# Patient Record
Sex: Female | Born: 1970 | Race: Black or African American | Hispanic: No | Marital: Single | Smoking: Current some day smoker
Health system: Southern US, Community
[De-identification: ages and names within clinical notes are randomized; demographics above are authoritative.]

## PROBLEM LIST (undated history)

## (undated) DIAGNOSIS — R51 Headache: Secondary | ICD-10-CM

## (undated) DIAGNOSIS — R519 Headache, unspecified: Secondary | ICD-10-CM

## (undated) DIAGNOSIS — J45909 Unspecified asthma, uncomplicated: Secondary | ICD-10-CM

## (undated) DIAGNOSIS — C8190 Hodgkin lymphoma, unspecified, unspecified site: Secondary | ICD-10-CM

## (undated) DIAGNOSIS — S2239XA Fracture of one rib, unspecified side, initial encounter for closed fracture: Secondary | ICD-10-CM

## (undated) HISTORY — PX: TOE SURGERY: SHX1073

## (undated) HISTORY — DX: Fracture of one rib, unspecified side, initial encounter for closed fracture: S22.39XA

---

## 1998-02-14 ENCOUNTER — Inpatient Hospital Stay (HOSPITAL_COMMUNITY): Admission: AD | Admit: 1998-02-14 | Discharge: 1998-02-14 | Payer: Self-pay | Admitting: Nephrology

## 1998-03-13 ENCOUNTER — Other Ambulatory Visit: Admission: RE | Admit: 1998-03-13 | Discharge: 1998-03-13 | Payer: Self-pay | Admitting: Nephrology

## 1998-05-10 ENCOUNTER — Inpatient Hospital Stay (HOSPITAL_COMMUNITY): Admission: AD | Admit: 1998-05-10 | Discharge: 1998-05-10 | Payer: Self-pay | Admitting: Nephrology

## 1998-10-29 ENCOUNTER — Encounter: Payer: Self-pay | Admitting: Emergency Medicine

## 1998-10-29 ENCOUNTER — Emergency Department (HOSPITAL_COMMUNITY): Admission: EM | Admit: 1998-10-29 | Discharge: 1998-10-29 | Payer: Self-pay | Admitting: Emergency Medicine

## 1999-02-15 ENCOUNTER — Ambulatory Visit (HOSPITAL_BASED_OUTPATIENT_CLINIC_OR_DEPARTMENT_OTHER): Admission: RE | Admit: 1999-02-15 | Discharge: 1999-02-15 | Payer: Self-pay | Admitting: Ophthalmology

## 1999-05-09 ENCOUNTER — Inpatient Hospital Stay (HOSPITAL_COMMUNITY): Admission: AD | Admit: 1999-05-09 | Discharge: 1999-05-09 | Payer: Self-pay | Admitting: Nephrology

## 1999-08-01 ENCOUNTER — Inpatient Hospital Stay (HOSPITAL_COMMUNITY): Admission: RE | Admit: 1999-08-01 | Discharge: 1999-08-01 | Payer: Self-pay | Admitting: Nephrology

## 1999-09-08 ENCOUNTER — Emergency Department (HOSPITAL_COMMUNITY): Admission: EM | Admit: 1999-09-08 | Discharge: 1999-09-08 | Payer: Self-pay | Admitting: Emergency Medicine

## 1999-09-08 ENCOUNTER — Encounter: Payer: Self-pay | Admitting: Emergency Medicine

## 1999-12-02 ENCOUNTER — Inpatient Hospital Stay (HOSPITAL_COMMUNITY): Admission: AD | Admit: 1999-12-02 | Discharge: 1999-12-02 | Payer: Self-pay | Admitting: Nephrology

## 2000-02-17 ENCOUNTER — Inpatient Hospital Stay (HOSPITAL_COMMUNITY): Admission: EM | Admit: 2000-02-17 | Discharge: 2000-02-17 | Payer: Self-pay | Admitting: *Deleted

## 2000-05-12 ENCOUNTER — Inpatient Hospital Stay (HOSPITAL_COMMUNITY): Admission: RE | Admit: 2000-05-12 | Discharge: 2000-05-12 | Payer: Self-pay | Admitting: *Deleted

## 2000-08-06 ENCOUNTER — Inpatient Hospital Stay (HOSPITAL_COMMUNITY): Admission: AD | Admit: 2000-08-06 | Discharge: 2000-08-06 | Payer: Self-pay | Admitting: *Deleted

## 2000-09-19 ENCOUNTER — Emergency Department (HOSPITAL_COMMUNITY): Admission: EM | Admit: 2000-09-19 | Discharge: 2000-09-19 | Payer: Self-pay | Admitting: Emergency Medicine

## 2000-11-02 ENCOUNTER — Inpatient Hospital Stay (HOSPITAL_COMMUNITY): Admission: AD | Admit: 2000-11-02 | Discharge: 2000-11-02 | Payer: Self-pay | Admitting: *Deleted

## 2001-01-25 ENCOUNTER — Inpatient Hospital Stay (HOSPITAL_COMMUNITY): Admission: AD | Admit: 2001-01-25 | Discharge: 2001-01-25 | Payer: Self-pay | Admitting: *Deleted

## 2003-01-31 ENCOUNTER — Other Ambulatory Visit: Admission: RE | Admit: 2003-01-31 | Discharge: 2003-01-31 | Payer: Self-pay | Admitting: Obstetrics & Gynecology

## 2004-07-10 ENCOUNTER — Other Ambulatory Visit: Admission: RE | Admit: 2004-07-10 | Discharge: 2004-07-10 | Payer: Self-pay | Admitting: Obstetrics & Gynecology

## 2005-08-20 ENCOUNTER — Other Ambulatory Visit: Admission: RE | Admit: 2005-08-20 | Discharge: 2005-08-20 | Payer: Self-pay | Admitting: Obstetrics & Gynecology

## 2006-05-01 ENCOUNTER — Ambulatory Visit: Payer: Self-pay | Admitting: Internal Medicine

## 2006-05-05 ENCOUNTER — Ambulatory Visit: Payer: Self-pay | Admitting: Internal Medicine

## 2006-05-14 ENCOUNTER — Ambulatory Visit: Payer: Self-pay | Admitting: Internal Medicine

## 2006-05-21 ENCOUNTER — Encounter: Admission: RE | Admit: 2006-05-21 | Discharge: 2006-05-21 | Payer: Self-pay | Admitting: Internal Medicine

## 2008-01-11 ENCOUNTER — Emergency Department (HOSPITAL_COMMUNITY): Admission: EM | Admit: 2008-01-11 | Discharge: 2008-01-11 | Payer: Self-pay | Admitting: Emergency Medicine

## 2008-01-13 ENCOUNTER — Encounter: Payer: Self-pay | Admitting: Internal Medicine

## 2010-07-28 ENCOUNTER — Emergency Department (HOSPITAL_COMMUNITY): Admission: EM | Admit: 2010-07-28 | Discharge: 2010-07-28 | Payer: Self-pay | Admitting: Emergency Medicine

## 2010-10-02 ENCOUNTER — Emergency Department (HOSPITAL_COMMUNITY)
Admission: EM | Admit: 2010-10-02 | Discharge: 2010-10-02 | Payer: Self-pay | Source: Home / Self Care | Admitting: Emergency Medicine

## 2012-11-19 ENCOUNTER — Emergency Department (INDEPENDENT_AMBULATORY_CARE_PROVIDER_SITE_OTHER)
Admission: EM | Admit: 2012-11-19 | Discharge: 2012-11-19 | Disposition: A | Discharge status: Home / Self Care | Payer: Medicaid Other | Source: Home / Self Care

## 2012-11-19 ENCOUNTER — Encounter (HOSPITAL_COMMUNITY): Payer: Self-pay

## 2012-11-19 DIAGNOSIS — R030 Elevated blood-pressure reading, without diagnosis of hypertension: Secondary | ICD-10-CM

## 2012-11-19 DIAGNOSIS — B029 Zoster without complications: Secondary | ICD-10-CM

## 2012-11-19 HISTORY — DX: Unspecified asthma, uncomplicated: J45.909

## 2012-11-19 HISTORY — DX: Headache, unspecified: R51.9

## 2012-11-19 HISTORY — DX: Headache: R51

## 2012-11-19 LAB — POCT URINALYSIS DIP (DEVICE)
Bilirubin Urine: NEGATIVE
Glucose, UA: NEGATIVE mg/dL
Ketones, ur: NEGATIVE mg/dL
Leukocytes, UA: NEGATIVE
Nitrite: NEGATIVE
Protein, ur: NEGATIVE mg/dL
Specific Gravity, Urine: 1.025 (ref 1.005–1.030)
Urobilinogen, UA: 0.2 mg/dL (ref 0.0–1.0)
pH: 5.5 (ref 5.0–8.0)

## 2012-11-19 LAB — POCT PREGNANCY, URINE: Preg Test, Ur: NEGATIVE

## 2012-11-19 MED ORDER — HYDROCODONE-ACETAMINOPHEN 5-325 MG PO TABS: 1.0000 | ORAL_TABLET | ORAL | Status: DC | PRN

## 2012-11-19 MED ORDER — ACYCLOVIR 800 MG PO TABS: 800.0000 mg | ORAL_TABLET | Freq: Every day | ORAL | Status: DC

## 2012-11-19 MED ORDER — CYCLOBENZAPRINE HCL 10 MG PO TABS: 10.0000 mg | ORAL_TABLET | Freq: Three times a day (TID) | ORAL | Status: DC | PRN

## 2012-11-19 MED ORDER — IBUPROFEN 600 MG PO TABS
600.0000 mg | ORAL_TABLET | Freq: Four times a day (QID) | ORAL | Status: DC | PRN
Start: 2012-11-19 — End: 2013-07-30

## 2012-11-19 NOTE — ED Provider Notes (Signed)
History     CSN: [id number]  Arrival date & time 11/19/12  1311   First MD Initiated Contact with Patient 11/19/12 1504      Chief Complaint  Patient presents with  . Flank Pain    (Consider location/radiation/quality/duration/timing/severity/associated sxs/prior treatment) HPI 42 y.o. female with pain Right flank/back now moving around to front. Sharp/crampy. Worse with movement. Broke out in red blisters on abdomen near belly button starting last night. Pain in abdomen is sharp. No fever/chills/sweats. No nausea, vomiting, constipation, diarrhea. Some numbness on front of stomach on right near blisterse. No vaginal discharge, discomfort, or dysuria. Has had some abdominal bloating/gas recently relieved by gas-ex. Has been taking Advil for pain which helps some.  LMP:  2 years ago. Last pap 2 yrs ago. Saw OB but didn't have health insurance so not followed up.   Past Medical History  Diagnosis Date  . Asthma   . Head ache   Hodgkins lymphoma age 26, treated at [facility], completed treatment and in remission, released from care.   History reviewed. No pertinent past surgical history. Bone marrow biopsy.  History reviewed. No pertinent family history. Cancer (PGF prostate).  History  Substance Use Topics  . Smoking status: Not on file  . Smokeless tobacco: Not on file  . Alcohol Use:     OB History    Grav Para Term Preterm Abortions TAB SAB Ect Mult Living                  Review of Systems  Constitutional: Negative for fever, chills, diaphoresis and fatigue.  HENT: Negative for neck pain and neck stiffness.   Respiratory: Negative for cough and shortness of breath.   Cardiovascular: Negative for chest pain and palpitations.  Gastrointestinal: Positive for abdominal distention. Negative for nausea, vomiting, diarrhea and constipation.  Genitourinary: Positive for flank pain. Negative for dysuria, urgency, frequency, vaginal bleeding, vaginal discharge,  difficulty urinating and pelvic pain.  Musculoskeletal: Positive for back pain (upper right - just at/under rib cage).  Skin: Positive for rash.  Neurological: Negative for dizziness, weakness, light-headedness and headaches.    Allergies  Review of patient's allergies indicates no known allergies.  Home Medications  No current outpatient prescriptions on file.  BP 153/79  Pulse 78  Temp 97.9 F (36.6 C) (Oral)  Resp 18  SpO2 99%  Physical Exam  Constitutional: She is oriented to person, place, and time. She appears well-developed and well-nourished. No distress.  HENT:  Head: Normocephalic and atraumatic.  Eyes: Conjunctivae normal and EOM are normal.  Neck: Normal range of motion. Neck supple.  Cardiovascular: Normal rate, regular rhythm and normal heart sounds.   Pulmonary/Chest: Effort normal and breath sounds normal. No respiratory distress. She has no wheezes.  Abdominal: Soft. Bowel sounds are normal. She exhibits no distension. There is no tenderness. There is no rebound and no guarding.    Musculoskeletal: Normal range of motion. She exhibits no edema and no tenderness.       No tenderness of back, no CVA tenderness  Neurological: She is alert and oriented to person, place, and time.  Skin: Skin is warm and dry.       2 small clusters of red, vesicular lesions on right anterior abdomen, one just near umbilicus, other a few cm lateral and inferior to umbilicus.  Psychiatric: She has a normal mood and affect.   Blsters clustered on right umbilicus and just below;.  Results for orders placed during the hospital encounter  of 11/19/12 (from the past 24 hour(s))  POCT URINALYSIS DIP (DEVICE)     Status: Abnormal   Collection Time   11/19/12  2:41 PM      Component Value Range   Glucose, UA NEGATIVE  NEGATIVE mg/dL   Bilirubin Urine NEGATIVE  NEGATIVE   Ketones, ur NEGATIVE  NEGATIVE mg/dL   Specific Gravity, Urine 1.025  1.005 - 1.030   Hgb urine dipstick TRACE (*)  NEGATIVE   pH 5.5  5.0 - 8.0   Protein, ur NEGATIVE  NEGATIVE mg/dL   Urobilinogen, UA 0.2  0.0 - 1.0 mg/dL   Nitrite NEGATIVE  NEGATIVE   Leukocytes, UA NEGATIVE  NEGATIVE  POCT PREGNANCY, URINE     Status: Normal   Collection Time   11/19/12  2:43 PM      Component Value Range   Preg Test, Ur NEGATIVE  NEGATIVE    ED Course  Procedures (including critical care time)  Labs Reviewed  POCT URINALYSIS DIP (DEVICE) - Abnormal; Notable for the following:    Hgb urine dipstick TRACE (*)     All other components within normal limits  POCT PREGNANCY, URINE   No results found.   1. Shingles   2. Elevated blood pressure     - Acyclovir 800 mg 5x daily x 7 days - Ibuprofen - vicodin for nighttime pain - Establish care with PCP for BP f/u        Napoleon Form, MD 11/19/12 1617

## 2012-11-19 NOTE — ED Notes (Signed)
C/o 2-3 day duration of pain in her right flank to groin w "numb " area on flank and raised bumps near  Her  Navel

## 2012-11-19 NOTE — ED Notes (Signed)
C/o pain in her back; assessed by MD only

## 2013-07-30 ENCOUNTER — Emergency Department (HOSPITAL_COMMUNITY): Payer: Medicaid Other

## 2013-07-30 ENCOUNTER — Encounter (HOSPITAL_COMMUNITY): Payer: Self-pay | Admitting: Emergency Medicine

## 2013-07-30 ENCOUNTER — Emergency Department (HOSPITAL_COMMUNITY)
Admission: EM | Admit: 2013-07-30 | Discharge: 2013-07-30 | Disposition: A | Discharge status: Home / Self Care | Payer: Medicaid Other | Attending: Emergency Medicine | Admitting: Emergency Medicine

## 2013-07-30 DIAGNOSIS — M25539 Pain in unspecified wrist: Secondary | ICD-10-CM | POA: Insufficient documentation

## 2013-07-30 DIAGNOSIS — R209 Unspecified disturbances of skin sensation: Secondary | ICD-10-CM | POA: Insufficient documentation

## 2013-07-30 DIAGNOSIS — M25569 Pain in unspecified knee: Secondary | ICD-10-CM | POA: Insufficient documentation

## 2013-07-30 DIAGNOSIS — Z8571 Personal history of Hodgkin lymphoma: Secondary | ICD-10-CM | POA: Insufficient documentation

## 2013-07-30 DIAGNOSIS — G8911 Acute pain due to trauma: Secondary | ICD-10-CM | POA: Insufficient documentation

## 2013-07-30 DIAGNOSIS — S6991XD Unspecified injury of right wrist, hand and finger(s), subsequent encounter: Secondary | ICD-10-CM

## 2013-07-30 DIAGNOSIS — G839 Paralytic syndrome, unspecified: Secondary | ICD-10-CM | POA: Insufficient documentation

## 2013-07-30 DIAGNOSIS — G589 Mononeuropathy, unspecified: Secondary | ICD-10-CM

## 2013-07-30 DIAGNOSIS — J45909 Unspecified asthma, uncomplicated: Secondary | ICD-10-CM | POA: Insufficient documentation

## 2013-07-30 DIAGNOSIS — F17200 Nicotine dependence, unspecified, uncomplicated: Secondary | ICD-10-CM | POA: Insufficient documentation

## 2013-07-30 HISTORY — DX: Hodgkin lymphoma, unspecified, unspecified site: C81.90

## 2013-07-30 NOTE — ED Notes (Signed)
Pt states she was prescribed pain medication but has not taken it because she "doesn't like pain medication". Denies taking anything otc either
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
## 2013-10-13 ENCOUNTER — Emergency Department (HOSPITAL_COMMUNITY): Payer: No Typology Code available for payment source

## 2013-10-13 ENCOUNTER — Other Ambulatory Visit: Payer: Self-pay

## 2013-10-13 ENCOUNTER — Emergency Department (HOSPITAL_COMMUNITY)
Admission: EM | Admit: 2013-10-13 | Discharge: 2013-10-13 | Disposition: A | Discharge status: Home / Self Care | Payer: No Typology Code available for payment source | Attending: Emergency Medicine | Admitting: Emergency Medicine

## 2013-10-13 ENCOUNTER — Encounter (HOSPITAL_COMMUNITY): Payer: Self-pay | Admitting: Emergency Medicine

## 2013-10-13 DIAGNOSIS — Y939 Activity, unspecified: Secondary | ICD-10-CM | POA: Insufficient documentation

## 2013-10-13 DIAGNOSIS — R51 Headache: Secondary | ICD-10-CM | POA: Insufficient documentation

## 2013-10-13 DIAGNOSIS — S2239XA Fracture of one rib, unspecified side, initial encounter for closed fracture: Secondary | ICD-10-CM | POA: Insufficient documentation

## 2013-10-13 DIAGNOSIS — M549 Dorsalgia, unspecified: Secondary | ICD-10-CM | POA: Insufficient documentation

## 2013-10-13 DIAGNOSIS — Y92410 Unspecified street and highway as the place of occurrence of the external cause: Secondary | ICD-10-CM | POA: Insufficient documentation

## 2013-10-13 DIAGNOSIS — Z8669 Personal history of other diseases of the nervous system and sense organs: Secondary | ICD-10-CM | POA: Insufficient documentation

## 2013-10-13 DIAGNOSIS — Z8571 Personal history of Hodgkin lymphoma: Secondary | ICD-10-CM | POA: Diagnosis not present

## 2013-10-13 DIAGNOSIS — S2231XA Fracture of one rib, right side, initial encounter for closed fracture: Secondary | ICD-10-CM

## 2013-10-13 DIAGNOSIS — J45909 Unspecified asthma, uncomplicated: Secondary | ICD-10-CM | POA: Insufficient documentation

## 2013-10-13 DIAGNOSIS — IMO0001 Reserved for inherently not codable concepts without codable children: Secondary | ICD-10-CM | POA: Insufficient documentation

## 2013-10-13 DIAGNOSIS — F17200 Nicotine dependence, unspecified, uncomplicated: Secondary | ICD-10-CM | POA: Insufficient documentation

## 2013-10-13 DIAGNOSIS — S298XXA Other specified injuries of thorax, initial encounter: Secondary | ICD-10-CM | POA: Diagnosis present

## 2013-10-13 MED ORDER — OXYCODONE-ACETAMINOPHEN 5-325 MG PO TABS: ORAL_TABLET | ORAL | Status: DC

## 2013-10-13 MED ORDER — OXYCODONE-ACETAMINOPHEN 5-325 MG PO TABS
2.0000 | ORAL_TABLET | Freq: Once | ORAL | Status: AC
  Administered 2013-10-13: 2 via ORAL
  Filled 2013-10-13: qty 2

## 2013-10-13 MED ORDER — IBUPROFEN 600 MG PO TABS: 600.0000 mg | ORAL_TABLET | Freq: Four times a day (QID) | ORAL | Status: DC | PRN

## 2013-10-13 MED ORDER — IBUPROFEN 800 MG PO TABS
800.0000 mg | ORAL_TABLET | Freq: Once | ORAL | Status: AC
  Administered 2013-10-13: 800 mg via ORAL
  Filled 2013-10-13: qty 1

## 2013-10-13 NOTE — ED Provider Notes (Signed)
Medical screening examination/treatment/procedure(s) were performed by non-physician practitioner and as supervising physician I was immediately available for consultation/collaboration.  EKG Interpretation   None         Richardean Canal, MD 10/13/13 [phone]
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
## 2013-10-23 ENCOUNTER — Encounter (HOSPITAL_COMMUNITY): Payer: Self-pay | Admitting: Emergency Medicine

## 2013-10-23 ENCOUNTER — Emergency Department (HOSPITAL_COMMUNITY): Payer: No Typology Code available for payment source

## 2013-10-23 ENCOUNTER — Emergency Department (HOSPITAL_COMMUNITY)
Admission: EM | Admit: 2013-10-23 | Discharge: 2013-10-23 | Disposition: A | Discharge status: Home / Self Care | Payer: No Typology Code available for payment source | Attending: Emergency Medicine | Admitting: Emergency Medicine

## 2013-10-23 DIAGNOSIS — F17200 Nicotine dependence, unspecified, uncomplicated: Secondary | ICD-10-CM | POA: Diagnosis not present

## 2013-10-23 DIAGNOSIS — Y92410 Unspecified street and highway as the place of occurrence of the external cause: Secondary | ICD-10-CM | POA: Insufficient documentation

## 2013-10-23 DIAGNOSIS — G8911 Acute pain due to trauma: Secondary | ICD-10-CM | POA: Diagnosis not present

## 2013-10-23 DIAGNOSIS — IMO0001 Reserved for inherently not codable concepts without codable children: Secondary | ICD-10-CM | POA: Diagnosis not present

## 2013-10-23 DIAGNOSIS — R071 Chest pain on breathing: Secondary | ICD-10-CM | POA: Insufficient documentation

## 2013-10-23 DIAGNOSIS — S63509A Unspecified sprain of unspecified wrist, initial encounter: Secondary | ICD-10-CM | POA: Insufficient documentation

## 2013-10-23 DIAGNOSIS — M549 Dorsalgia, unspecified: Secondary | ICD-10-CM | POA: Diagnosis not present

## 2013-10-23 DIAGNOSIS — J45909 Unspecified asthma, uncomplicated: Secondary | ICD-10-CM | POA: Insufficient documentation

## 2013-10-23 DIAGNOSIS — R079 Chest pain, unspecified: Secondary | ICD-10-CM | POA: Diagnosis present

## 2013-10-23 DIAGNOSIS — Y9389 Activity, other specified: Secondary | ICD-10-CM | POA: Diagnosis not present

## 2013-10-23 DIAGNOSIS — S63501A Unspecified sprain of right wrist, initial encounter: Secondary | ICD-10-CM

## 2013-10-23 DIAGNOSIS — R0789 Other chest pain: Secondary | ICD-10-CM

## 2013-10-23 DIAGNOSIS — Z8571 Personal history of Hodgkin lymphoma: Secondary | ICD-10-CM | POA: Insufficient documentation

## 2013-10-23 MED ORDER — OXYCODONE-ACETAMINOPHEN 5-325 MG PO TABS: 1.0000 | ORAL_TABLET | ORAL | Status: DC | PRN

## 2013-10-23 MED ORDER — IBUPROFEN 600 MG PO TABS: 600.0000 mg | ORAL_TABLET | Freq: Four times a day (QID) | ORAL | Status: DC | PRN

## 2013-10-23 NOTE — ED Provider Notes (Signed)
Medical screening examination/treatment/procedure(s) were performed by non-physician practitioner and as supervising physician I was immediately available for consultation/collaboration.  EKG Interpretation   None        Jamariya Davidoff, MD 10/23/13 1810 

## 2013-10-23 NOTE — Discharge Instructions (Signed)
Chest Wall Pain Chest wall pain is pain felt in or around the chest bones and muscles. It may take up to 6 weeks to get better. It may take longer if you are active. Chest wall pain can happen on its own. Other times, things like germs, injury, coughing, or exercise can cause the pain. HOME CARE   Avoid activities that make you tired or cause pain. Try not to use your chest, belly (abdominal), or side muscles. Do not use heavy weights.  Put ice on the sore area.  Put ice in a plastic bag.  Place a towel between your skin and the bag.  Leave the ice on for 15-20 minutes for the first 2 days.  Only take medicine as told by your doctor. GET HELP RIGHT AWAY IF:   You have more pain or are very uncomfortable.  You have a fever.  Your chest pain gets worse.  You have new problems.  You feel sick to your stomach (nauseous) or throw up (vomit).  You start to sweat or feel lightheaded.  You have a cough with mucus (phlegm).  You cough up blood. MAKE SURE YOU:   Understand these instructions.  Will watch your condition.  Will get help right away if you are not doing well or get worse. Document Released: 03/24/2008 Document Revised: 12/29/2011 Document Reviewed: 06/02/2011 [facility] Patient Information 2014 [place], [place]. Cryotherapy Cryotherapy means treatment with cold. Ice or gel packs can be used to reduce both pain and swelling. Ice is the most helpful within the first 24 to 48 hours after an injury or flareup from overusing a muscle or joint. Sprains, strains, spasms, burning pain, shooting pain, and aches can all be eased with ice. Ice can also be used when recovering from surgery. Ice is effective, has very few side effects, and is safe for most people to use. PRECAUTIONS  Ice is not a safe treatment option for people with:  Raynaud's phenomenon. This is a condition affecting small blood vessels in the extremities. Exposure to cold may cause your problems to  return.  Cold hypersensitivity. There are many forms of cold hypersensitivity, including:  Cold urticaria. Red, itchy hives appear on the skin when the tissues begin to warm after being iced.  Cold erythema. This is a red, itchy rash caused by exposure to cold.  Cold hemoglobinuria. Red blood cells break down when the tissues begin to warm after being iced. The hemoglobin that carry oxygen are passed into the urine because they cannot combine with blood proteins fast enough.  Numbness or altered sensitivity in the area being iced. If you have any of the following conditions, do not use ice until you have discussed cryotherapy with your caregiver:  Heart conditions, such as arrhythmia, angina, or chronic heart disease.  High blood pressure.  Healing wounds or open skin in the area being iced.  Current infections.  Rheumatoid arthritis.  Poor circulation.  Diabetes. Ice slows the blood flow in the region it is applied. This is beneficial when trying to stop inflamed tissues from spreading irritating chemicals to surrounding tissues. However, if you expose your skin to cold temperatures for too long or without the proper protection, you can damage your skin or nerves. Watch for signs of skin damage due to cold. HOME CARE INSTRUCTIONS Follow these tips to use ice and cold packs safely.  Place a dry or damp towel between the ice and skin. A damp towel will cool the skin more quickly, so you may  need to shorten the time that the ice is used.  For a more rapid response, add gentle compression to the ice.  Ice for no more than 10 to 20 minutes at a time. The bonier the area you are icing, the less time it will take to get the benefits of ice.  Check your skin after 5 minutes to make sure there are no signs of a poor response to cold or skin damage.  Rest 20 minutes or more in between uses.  Once your skin is numb, you can end your treatment. You can test numbness by very lightly  touching your skin. The touch should be so light that you do not see the skin dimple from the pressure of your fingertip. When using ice, most people will feel these normal sensations in this order: cold, burning, aching, and numbness.  Do not use ice on someone who cannot communicate their responses to pain, such as small children or people with dementia. HOW TO MAKE AN ICE PACK Ice packs are the most common way to use ice therapy. Other methods include ice massage, ice baths, and cryo-sprays. Muscle creams that cause a cold, tingly feeling do not offer the same benefits that ice offers and should not be used as a substitute unless recommended by your caregiver. To make an ice pack, do one of the following:  Place crushed ice or a bag of frozen vegetables in a sealable plastic bag. Squeeze out the excess air. Place this bag inside another plastic bag. Slide the bag into a pillowcase or place a damp towel between your skin and the bag.  Mix 3 parts water with 1 part rubbing alcohol. Freeze the mixture in a sealable plastic bag. When you remove the mixture from the freezer, it will be slushy. Squeeze out the excess air. Place this bag inside another plastic bag. Slide the bag into a pillowcase or place a damp towel between your skin and the bag. SEEK MEDICAL CARE IF:  You develop white spots on your skin. This may give the skin a blotchy (mottled) appearance.  Your skin turns blue or pale.  Your skin becomes waxy or hard.  Your swelling gets worse. MAKE SURE YOU:   Understand these instructions.  Will watch your condition.  Will get help right away if you are not doing well or get worse. Document Released: 06/02/2011 Document Revised: 12/29/2011 Document Reviewed: 06/02/2011 [facility] Patient Information 2014 [place], [place]. Wrist Pain Wrist injuries are frequent in adults and children. A sprain is an injury to the ligaments that hold your bones together. A strain is an injury to muscle  or muscle cord-like structures (tendons) from stretching or pulling. Generally, when wrists are moderately tender to touch following a fall or injury, a break in the bone (fracture) may be present. Most wrist sprains or strains are better in 3 to 5 days, but complete healing may take several weeks. HOME CARE INSTRUCTIONS   Put ice on the injured area.  Put ice in a plastic bag.  Place a towel between your skin and the bag.  Leave the ice on for 15-20 minutes, 03-04 times a day, for the first 2 days.  Keep your arm raised above the level of your heart whenever possible to reduce swelling and pain.  Rest the injured area for at least 48 hours or as directed by your caregiver.  If a splint or elastic bandage has been applied, use it for as long as directed by your caregiver  or until seen by a caregiver for a follow-up exam.  Only take over-the-counter or prescription medicines for pain, discomfort, or fever as directed by your caregiver.  Keep all follow-up appointments. You may need to follow up with a specialist or have follow-up X-rays. Improvement in pain level is not a guarantee that you did not fracture a bone in your wrist. The only way to determine whether or not you have a broken bone is by X-ray. SEEK IMMEDIATE MEDICAL CARE IF:   Your fingers are swollen, very red, white, or cold and blue.  Your fingers are numb or tingling.  You have increasing pain.  You have difficulty moving your fingers. MAKE SURE YOU:   Understand these instructions.  Will watch your condition.  Will get help right away if you are not doing well or get worse. Document Released: 07/16/2005 Document Revised: 12/29/2011 Document Reviewed: 11/27/2010 [facility] Patient Information 2014 [place].
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
## 2013-11-24 ENCOUNTER — Ambulatory Visit: Payer: No Typology Code available for payment source | Attending: Internal Medicine | Admitting: Internal Medicine

## 2013-11-24 ENCOUNTER — Encounter: Payer: Self-pay | Admitting: Internal Medicine

## 2013-11-24 DIAGNOSIS — M25539 Pain in unspecified wrist: Secondary | ICD-10-CM | POA: Diagnosis not present

## 2013-11-24 DIAGNOSIS — S2239XA Fracture of one rib, unspecified side, initial encounter for closed fracture: Secondary | ICD-10-CM

## 2013-11-24 DIAGNOSIS — R079 Chest pain, unspecified: Secondary | ICD-10-CM | POA: Diagnosis not present

## 2013-11-24 DIAGNOSIS — F17200 Nicotine dependence, unspecified, uncomplicated: Secondary | ICD-10-CM | POA: Insufficient documentation

## 2013-11-24 DIAGNOSIS — IMO0001 Reserved for inherently not codable concepts without codable children: Secondary | ICD-10-CM | POA: Insufficient documentation

## 2013-11-24 HISTORY — DX: Fracture of one rib, unspecified side, initial encounter for closed fracture: S22.39XA

## 2013-11-24 LAB — COMPLETE METABOLIC PANEL WITH GFR
ALK PHOS: 89 U/L (ref 39–117)
ALT: 18 U/L (ref 0–35)
AST: 16 U/L (ref 0–37)
Albumin: 4.3 g/dL (ref 3.5–5.2)
BILIRUBIN TOTAL: 0.2 mg/dL (ref 0.2–1.2)
BUN: 13 mg/dL (ref 6–23)
CO2: 25 mEq/L (ref 19–32)
Calcium: 10 mg/dL (ref 8.4–10.5)
Chloride: 106 mEq/L (ref 96–112)
Creat: 0.81 mg/dL (ref 0.50–1.10)
GFR, Est African American: >89 mL/min
GLUCOSE: 78 mg/dL (ref 70–99)
Potassium: 4.4 mEq/L (ref 3.5–5.3)
SODIUM: 139 meq/L (ref 135–145)
TOTAL PROTEIN: 7 g/dL (ref 6.0–8.3)

## 2013-11-24 LAB — LIPID PANEL
CHOL/HDL RATIO: 2.7 ratio
CHOLESTEROL: 174 mg/dL (ref 0–200)
HDL: 64 mg/dL (ref >39)
LDL Cholesterol: 100 mg/dL — ABNORMAL HIGH (ref 0–99)
TRIGLYCERIDES: 49 mg/dL (ref <150)
VLDL: 10 mg/dL (ref 0–40)

## 2013-11-24 LAB — CBC WITH DIFFERENTIAL/PLATELET
BASOS PCT: 0 % (ref 0–1)
Basophils Absolute: 0 10*3/uL (ref 0.0–0.1)
EOS ABS: 0.1 10*3/uL (ref 0.0–0.7)
Eosinophils Relative: 1 % (ref 0–5)
HCT: 36.4 % (ref 36.0–46.0)
HEMOGLOBIN: 12 g/dL (ref 12.0–15.0)
Lymphocytes Relative: 29 % (ref 12–46)
Lymphs Abs: 2.5 10*3/uL (ref 0.7–4.0)
MCH: 28.8 pg (ref 26.0–34.0)
MCHC: 33 g/dL (ref 30.0–36.0)
MCV: 87.5 fL (ref 78.0–100.0)
MONOS PCT: 4 % (ref 3–12)
Monocytes Absolute: 0.3 10*3/uL (ref 0.1–1.0)
NEUTROS PCT: 66 % (ref 43–77)
Neutro Abs: 5.6 10*3/uL (ref 1.7–7.7)
Platelets: 319 10*3/uL (ref 150–400)
RBC: 4.16 MIL/uL (ref 3.87–5.11)
RDW: 14.7 % (ref 11.5–15.5)
WBC: 8.6 10*3/uL (ref 4.0–10.5)

## 2013-11-24 LAB — TSH: TSH: 0.591 u[IU]/mL (ref 0.350–4.500)

## 2013-11-24 MED ORDER — ACETAMINOPHEN-CODEINE #3 300-30 MG PO TABS: 1.0000 | ORAL_TABLET | ORAL | Status: DC | PRN
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
## 2013-11-24 NOTE — Patient Instructions (Signed)
Exercise to Stay Healthy Exercise helps you become and stay healthy. EXERCISE IDEAS AND TIPS Choose exercises that:  You enjoy.  Fit into your day. You do not need to exercise really hard to be healthy. You can do exercises at a slow or medium level and stay healthy. You can:  Stretch before and after working out.  Try yoga, Pilates, or tai chi.  Lift weights.  Walk fast, swim, jog, run, climb stairs, bicycle, dance, or rollerskate.  Take aerobic classes. Exercises that burn about 150 calories:  Running 1  miles in 15 minutes.  Playing volleyball for 45 to 60 minutes.  Washing and waxing a car for 45 to 60 minutes.  Playing touch football for 45 minutes.  Walking 1  miles in 35 minutes.  Pushing a stroller 1  miles in 30 minutes.  Playing basketball for 30 minutes.  Raking leaves for 30 minutes.  Bicycling 5 miles in 30 minutes.  Walking 2 miles in 30 minutes.  Dancing for 30 minutes.  Shoveling snow for 15 minutes.  Swimming laps for 20 minutes.  Walking up stairs for 15 minutes.  Bicycling 4 miles in 15 minutes.  Gardening for 30 to 45 minutes.  Jumping rope for 15 minutes.  Washing windows or floors for 45 to 60 minutes. Document Released: 11/08/2010 Document Revised: 12/29/2011 Document Reviewed: 11/08/2010 [employer] Patient Information 2014 [employer].  
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
## 2013-11-26 ENCOUNTER — Telehealth: Payer: Self-pay | Admitting: *Deleted

## 2013-11-26 NOTE — Telephone Encounter (Signed)
I spoke to pt and informed her that her labs were normal.

## 2013-11-26 NOTE — Telephone Encounter (Signed)
Message copied by Joan Mayans on Sat Nov 26, 2013 12:08 PM ------      Message from: Tresa Garter      Created: Fri Nov 25, 2013  2:26 PM       Please inform patient that all her results came back normal ------

## 2013-11-29 ENCOUNTER — Emergency Department (HOSPITAL_COMMUNITY)
Admission: EM | Admit: 2013-11-29 | Discharge: 2013-11-29 | Disposition: A | Discharge status: Home / Self Care | Payer: No Typology Code available for payment source | Attending: Emergency Medicine | Admitting: Emergency Medicine

## 2013-11-29 ENCOUNTER — Emergency Department (HOSPITAL_COMMUNITY): Payer: No Typology Code available for payment source

## 2013-11-29 ENCOUNTER — Encounter (HOSPITAL_COMMUNITY): Payer: Self-pay | Admitting: Emergency Medicine

## 2013-11-29 DIAGNOSIS — Y92410 Unspecified street and highway as the place of occurrence of the external cause: Secondary | ICD-10-CM | POA: Diagnosis not present

## 2013-11-29 DIAGNOSIS — F17200 Nicotine dependence, unspecified, uncomplicated: Secondary | ICD-10-CM | POA: Diagnosis not present

## 2013-11-29 DIAGNOSIS — J45909 Unspecified asthma, uncomplicated: Secondary | ICD-10-CM | POA: Diagnosis not present

## 2013-11-29 DIAGNOSIS — S298XXA Other specified injuries of thorax, initial encounter: Secondary | ICD-10-CM | POA: Diagnosis not present

## 2013-11-29 DIAGNOSIS — Y9389 Activity, other specified: Secondary | ICD-10-CM | POA: Insufficient documentation

## 2013-11-29 DIAGNOSIS — M549 Dorsalgia, unspecified: Secondary | ICD-10-CM

## 2013-11-29 DIAGNOSIS — I700 Atherosclerosis of aorta: Secondary | ICD-10-CM | POA: Diagnosis not present

## 2013-11-29 DIAGNOSIS — IMO0002 Reserved for concepts with insufficient information to code with codable children: Secondary | ICD-10-CM | POA: Insufficient documentation

## 2013-11-29 DIAGNOSIS — R0781 Pleurodynia: Secondary | ICD-10-CM

## 2013-11-29 DIAGNOSIS — F411 Generalized anxiety disorder: Secondary | ICD-10-CM | POA: Insufficient documentation

## 2013-11-29 DIAGNOSIS — S0990XA Unspecified injury of head, initial encounter: Secondary | ICD-10-CM | POA: Insufficient documentation

## 2013-11-29 DIAGNOSIS — Z862 Personal history of diseases of the blood and blood-forming organs and certain disorders involving the immune mechanism: Secondary | ICD-10-CM | POA: Diagnosis not present

## 2013-11-29 MED ORDER — DIAZEPAM 5 MG PO TABS
5.0000 mg | ORAL_TABLET | Freq: Once | ORAL | Status: AC
  Administered 2013-11-29: 5 mg via ORAL
  Filled 2013-11-29: qty 1

## 2013-11-29 MED ORDER — DIAZEPAM 5 MG PO TABS: 5.0000 mg | ORAL_TABLET | Freq: Four times a day (QID) | ORAL | Status: DC | PRN

## 2013-11-29 MED ORDER — ACETAMINOPHEN 500 MG PO TABS
1000.0000 mg | ORAL_TABLET | Freq: Once | ORAL | Status: AC
  Administered 2013-11-29: 1000 mg via ORAL
  Filled 2013-11-29: qty 2
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
## 2013-11-29 NOTE — ED Notes (Signed)
PA at bedside.

## 2013-11-29 NOTE — ED Provider Notes (Signed)
Medical screening examination/treatment/procedure(s) were performed by non-physician practitioner and as supervising physician I was immediately available for consultation/collaboration.  EKG Interpretation   None         Mariea Clonts, MD 11/29/13 [phone]
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
## 2014-02-23 ENCOUNTER — Ambulatory Visit: Payer: Medicaid Other | Admitting: Internal Medicine

## 2014-03-02 ENCOUNTER — Telehealth: Payer: Self-pay | Admitting: *Deleted

## 2014-07-02 ENCOUNTER — Encounter (HOSPITAL_COMMUNITY): Payer: Self-pay | Admitting: Emergency Medicine

## 2014-07-02 ENCOUNTER — Emergency Department (HOSPITAL_COMMUNITY)
Admission: EM | Admit: 2014-07-02 | Discharge: 2014-07-02 | Disposition: A | Discharge status: Home / Self Care | Payer: Self-pay | Attending: Emergency Medicine | Admitting: Emergency Medicine

## 2014-07-02 ENCOUNTER — Emergency Department (HOSPITAL_COMMUNITY): Payer: Self-pay

## 2014-07-02 DIAGNOSIS — F17200 Nicotine dependence, unspecified, uncomplicated: Secondary | ICD-10-CM | POA: Insufficient documentation

## 2014-07-02 DIAGNOSIS — Z79899 Other long term (current) drug therapy: Secondary | ICD-10-CM | POA: Insufficient documentation

## 2014-07-02 DIAGNOSIS — S4980XA Other specified injuries of shoulder and upper arm, unspecified arm, initial encounter: Secondary | ICD-10-CM | POA: Insufficient documentation

## 2014-07-02 DIAGNOSIS — Y9389 Activity, other specified: Secondary | ICD-10-CM | POA: Insufficient documentation

## 2014-07-02 DIAGNOSIS — W208XXA Other cause of strike by thrown, projected or falling object, initial encounter: Secondary | ICD-10-CM | POA: Insufficient documentation

## 2014-07-02 DIAGNOSIS — S46909A Unspecified injury of unspecified muscle, fascia and tendon at shoulder and upper arm level, unspecified arm, initial encounter: Secondary | ICD-10-CM | POA: Insufficient documentation

## 2014-07-02 DIAGNOSIS — M5380 Other specified dorsopathies, site unspecified: Secondary | ICD-10-CM | POA: Insufficient documentation

## 2014-07-02 DIAGNOSIS — S199XXA Unspecified injury of neck, initial encounter: Secondary | ICD-10-CM

## 2014-07-02 DIAGNOSIS — J069 Acute upper respiratory infection, unspecified: Secondary | ICD-10-CM | POA: Insufficient documentation

## 2014-07-02 DIAGNOSIS — S0993XA Unspecified injury of face, initial encounter: Secondary | ICD-10-CM | POA: Insufficient documentation

## 2014-07-02 DIAGNOSIS — Y9229 Other specified public building as the place of occurrence of the external cause: Secondary | ICD-10-CM | POA: Insufficient documentation

## 2014-07-02 DIAGNOSIS — J45909 Unspecified asthma, uncomplicated: Secondary | ICD-10-CM | POA: Insufficient documentation

## 2014-07-02 DIAGNOSIS — Z8571 Personal history of Hodgkin lymphoma: Secondary | ICD-10-CM | POA: Insufficient documentation

## 2014-07-02 DIAGNOSIS — M62838 Other muscle spasm: Secondary | ICD-10-CM | POA: Insufficient documentation

## 2014-07-02 MED ORDER — LIDOCAINE 5 % EX PTCH
1.0000 | MEDICATED_PATCH | CUTANEOUS | Status: DC
  Administered 2014-07-02: 1 via TRANSDERMAL
  Filled 2014-07-02: qty 1

## 2014-07-02 MED ORDER — ALBUTEROL SULFATE HFA 108 (90 BASE) MCG/ACT IN AERS: 1.0000 | INHALATION_SPRAY | Freq: Four times a day (QID) | RESPIRATORY_TRACT | Status: DC | PRN

## 2014-07-02 MED ORDER — IBUPROFEN 800 MG PO TABS
800.0000 mg | ORAL_TABLET | Freq: Once | ORAL | Status: AC
  Administered 2014-07-02: 800 mg via ORAL
  Filled 2014-07-02: qty 1

## 2014-07-02 MED ORDER — IBUPROFEN 800 MG PO TABS: 800.0000 mg | ORAL_TABLET | Freq: Three times a day (TID) | ORAL | Status: DC

## 2014-07-02 MED ORDER — METHOCARBAMOL 500 MG PO TABS: 500.0000 mg | ORAL_TABLET | Freq: Two times a day (BID) | ORAL | Status: DC

## 2014-07-02 NOTE — ED Provider Notes (Signed)
CSN: [id number]     Arrival date & time 07/02/14  1730 History  This chart was scribed for non-physician practitioner, Baron Sane, PA-C working with Dorie Rank, MD by Frederich Balding, ED scribe. This patient was seen in room WTR8/WTR8 and the patient's care was started at 5:57 PM.   Chief Complaint  Patient presents with  . Shoulder Pain  . Neck Pain   The history is provided by the patient. No language interpreter was used.   HPI Comments: Dawn Foster is a 43 y.o. female who presents to the Emergency Department complaining of left shoulder pain that radiates into her neck that started earlier today around 1 PM. Pt states boxes fell onto her left shoulder while she was at [employer]. Denies LOC. Certain movements worsen the pain. Pt has not done anything for her symptoms yet. Also reports chest congestion from a recent cold that has not resolved yet. She has taken NyQuil and Mucinex with some relief. Denies rhinorrhea.   Past Medical History  Diagnosis Date  . Asthma   . Head ache   . Hodgkin disease     as a child   History reviewed. No pertinent past surgical history. Family History  Problem Relation Age of Onset  . Cancer Maternal Grandmother   . Cancer Paternal Grandmother    History  Substance Use Topics  . Smoking status: Current Some Day Smoker -- 0.50 packs/day    Types: Cigarettes  . Smokeless tobacco: Not on file  . Alcohol Use: No   OB History   Grav Para Term Preterm Abortions TAB SAB Ect Mult Living                 Review of Systems  HENT: Positive for congestion. Negative for rhinorrhea.   Musculoskeletal: Positive for arthralgias and neck pain.  All other systems reviewed and are negative.  Allergies  Review of patient's allergies indicates no known allergies.  Home Medications   Prior to Admission medications   Medication Sig Start Date End Date Taking? Authorizing Provider  albuterol (PROVENTIL HFA;VENTOLIN HFA) 108 (90 BASE) MCG/ACT inhaler  Inhale 1-2 puffs into the lungs every 6 (six) hours as needed for wheezing or shortness of breath. 07/02/14   Jaxden Blyden L Gerry Blanchfield, PA-C  diazepam (VALIUM) 5 MG tablet Take 1 tablet (5 mg total) by mouth every 6 (six) hours as needed for anxiety (spasms). 11/29/13   Elwyn Lade, PA-C  ibuprofen (ADVIL,MOTRIN) 800 MG tablet Take 1 tablet (800 mg total) by mouth 3 (three) times daily. 07/02/14   Sakina Briones L Kyleeann Cremeans, PA-C  methocarbamol (ROBAXIN) 500 MG tablet Take 1 tablet (500 mg total) by mouth 2 (two) times daily. 07/02/14   Marquasia Schmieder L Elaiza Shoberg, PA-C   BP 152/99  Pulse 87  Temp(Src) 98.2 F (36.8 C) (Oral)  SpO2 100%  Physical Exam  Constitutional: She is oriented to person, place, and time. She appears well-developed and well-nourished. No distress.  HENT:  Head: Normocephalic and atraumatic.  Right Ear: External ear normal.  Left Ear: External ear normal.  Nose: Nose normal.  Eyes: Conjunctivae are normal.  Neck: Neck supple.  Cardiovascular: Normal rate.   Pulmonary/Chest: Effort normal.  Musculoskeletal:       Cervical back: She exhibits spasm.       Back:  Negative Empty Can Test Negative Adson's maneuver ROM intact with Apley Scratch Test  Neurological: She is alert and oriented to person, place, and time. GCS eye subscore is 4. GCS verbal subscore  is 5. GCS motor subscore is 6.  Skin: Skin is warm and dry. She is not diaphoretic.    ED Course  Procedures (including critical care time) Medications  lidocaine (LIDODERM) 5 % 1 patch (1 patch Transdermal Patch Applied 07/02/14 1834)  ibuprofen (ADVIL,MOTRIN) tablet 800 mg (800 mg Oral Given 07/02/14 1823)    DIAGNOSTIC STUDIES: Oxygen Saturation is 100% on RA, normal by my interpretation.    COORDINATION OF CARE: 6:00 PM-Discussed treatment plan which includes a muscle relaxer, motrin, lidocaine patch and chest xray with pt at bedside and pt agreed to plan.   Labs Review Labs Reviewed - No data to  display  Imaging Review Dg Chest 2 View  07/02/2014   CLINICAL DATA:  Productive cough for 1 week. No known cardiopulmonary disease. History of smoking.  EXAM: CHEST  2 VIEW  COMPARISON:  11/29/2013  FINDINGS: Heart size is normal. The lungs are clear. There are no focal consolidations or pleural effusions. No pulmonary edema. Visualized osseous structures have a normal appearance.  IMPRESSION: No active cardiopulmonary disease.   Electronically Signed   By: Shon Hale M.D.   On: 07/02/2014 18:38     EKG Interpretation None      MDM   Final diagnoses:  Muscle spasm of left shoulder area  URI (upper respiratory infection)    Filed Vitals:   07/02/14 1742  BP: 152/99  Pulse: 87  Temp: 98.2 F (36.8 C)   Afebrile, NAD, non-toxic appearing, AAOx4.   1) Muscle spasm: Neurovascularly intact. Normal sensation. PE shows no instability, tenderness, or deformity of acromioclavicular and sternoclavicular joints, the cervical spine, glenohumeral joint, coracoid process, acromion, or scapula. Good shoulder strength during empty can test. Good ROM during scratch test. No signs of impingement on Adson's manuever. Spasm appreciated.    2) URI: Pt CXR negative for acute infiltrate. Patients symptoms are consistent with URI, likely viral etiology. Discussed that antibiotics are not indicated for viral infections. Pt will be discharged with symptomatic treatment.  Verbalizes understanding and is agreeable with plan.    Pt is hemodynamically stable & in NAD prior to dc.   I personally performed the services described in this documentation, which was scribed in my presence. The recorded information has been reviewed and is accurate.  Harlow Mares, PA-C 07/02/14 1900

## 2014-07-02 NOTE — ED Notes (Addendum)
Pt states that she had some boxes fall on her L shoulder earlier today and she is now having shoulder pain and neck pain. Also states she has chest congestion from a recent illness. Alert and oriented.

## 2014-07-02 NOTE — Discharge Instructions (Signed)
Please follow up with your primary care physician in 1-2 days. If you do not have one please call the [place] number listed above. Please take pain medication and/or muscle relaxants as prescribed and as needed for pain. Please do not drive on narcotic pain medication or on muscle relaxants. Please use the inhaler 2 puffs every 4-6 hours for cough. Please read all discharge instructions and return precautions.    Cough, Adult  A cough is a reflex that helps clear your throat and airways. It can help heal the body or may be a reaction to an irritated airway. A cough may only last 2 or 3 weeks (acute) or may last more than 8 weeks (chronic).  CAUSES Acute cough:  Viral or bacterial infections. Chronic cough:  Infections.  Allergies.  Asthma.  Post-nasal drip.  Smoking.  Heartburn or acid reflux.  Some medicines.  Chronic lung problems (COPD).  Cancer. SYMPTOMS   Cough.  Fever.  Chest pain.  Increased breathing rate.  High-pitched whistling sound when breathing (wheezing).  Colored mucus that you cough up (sputum). TREATMENT   A bacterial cough may be treated with antibiotic medicine.  A viral cough must run its course and will not respond to antibiotics.  Your caregiver may recommend other treatments if you have a chronic cough. HOME CARE INSTRUCTIONS   Only take over-the-counter or prescription medicines for pain, discomfort, or fever as directed by your caregiver. Use cough suppressants only as directed by your caregiver.  Use a cold steam vaporizer or humidifier in your bedroom or home to help loosen secretions.  Sleep in a semi-upright position if your cough is worse at night.  Rest as needed.  Stop smoking if you smoke. SEEK IMMEDIATE MEDICAL CARE IF:   You have pus in your sputum.  Your cough starts to worsen.  You cannot control your cough with suppressants and are losing sleep.  You begin coughing up blood.  You have  difficulty breathing.  You develop pain which is getting worse or is uncontrolled with medicine.  You have a fever. MAKE SURE YOU:   Understand these instructions.  Will watch your condition.  Will get help right away if you are not doing well or get worse. Document Released: 04/04/2011 Document Revised: 12/29/2011 Document Reviewed: 04/04/2011 [facility] Patient Information 2015 [place], [place]. This information is not intended to replace advice given to you by your health care provider. Make sure you discuss any questions you have with your health care provider. Muscle Cramps and Spasms Muscle cramps and spasms occur when a muscle or muscles tighten and you have no control over this tightening (involuntary muscle contraction). They are a common problem and can develop in any muscle. The most common place is in the calf muscles of the leg. Both muscle cramps and muscle spasms are involuntary muscle contractions, but they also have differences:   Muscle cramps are sporadic and painful. They may last a few seconds to a quarter of an hour. Muscle cramps are often more forceful and last longer than muscle spasms.  Muscle spasms may or may not be painful. They may also last just a few seconds or much longer. CAUSES  It is uncommon for cramps or spasms to be due to a serious underlying problem. In many cases, the cause of cramps or spasms is unknown. Some common causes are:   Overexertion.   Overuse from repetitive motions (doing the same thing over and over).   Remaining in a  certain position for a long period of time.   Improper preparation, form, or technique while performing a sport or activity.   Dehydration.   Injury.   Side effects of some medicines.   Abnormally low levels of the salts and ions in your blood (electrolytes), especially potassium and calcium. This could happen if you are taking water pills (diuretics) or you are pregnant.  Some underlying medical problems  can make it more likely to develop cramps or spasms. These include, but are not limited to:   Diabetes.   Parkinson disease.   Hormone disorders, such as thyroid problems.   Alcohol abuse.   Diseases specific to muscles, joints, and bones.   Blood vessel disease where not enough blood is getting to the muscles.  HOME CARE INSTRUCTIONS   Stay well hydrated. Drink enough water and fluids to keep your urine clear or pale yellow.  It may be helpful to massage, stretch, and relax the affected muscle.  For tight or tense muscles, use a warm towel, heating pad, or hot shower water directed to the affected area.  If you are sore or have pain after a cramp or spasm, applying ice to the affected area may relieve discomfort.  Put ice in a plastic bag.  Place a towel between your skin and the bag.  Leave the ice on for 15-20 minutes, 03-04 times a day.  Medicines used to treat a known cause of cramps or spasms may help reduce their frequency or severity. Only take over-the-counter or prescription medicines as directed by your caregiver. SEEK MEDICAL CARE IF:  Your cramps or spasms get more severe, more frequent, or do not improve over time.  MAKE SURE YOU:   Understand these instructions.  Will watch your condition.  Will get help right away if you are not doing well or get worse. Document Released: 03/28/2002 Document Revised: 01/31/2013 Document Reviewed: 09/22/2012 [facility] Patient Information 2015 [place], [place]. This information is not intended to replace advice given to you by your health care provider. Make sure you discuss any questions you have with your health care provider.

## 2014-07-05 NOTE — ED Provider Notes (Signed)
Medical screening examination/treatment/procedure(s) were performed by non-physician practitioner and as supervising physician I was immediately available for consultation/collaboration.    Dorie Rank, MD 07/05/14 [phone]

## 2014-07-24 ENCOUNTER — Inpatient Hospital Stay: Payer: Medicaid Other | Admitting: Internal Medicine

## 2014-08-08 ENCOUNTER — Ambulatory Visit: Payer: Medicaid Other

## 2014-08-29 ENCOUNTER — Ambulatory Visit: Payer: Medicaid Other | Attending: Internal Medicine

## 2015-01-16 ENCOUNTER — Emergency Department (INDEPENDENT_AMBULATORY_CARE_PROVIDER_SITE_OTHER)
Admission: EM | Admit: 2015-01-16 | Discharge: 2015-01-16 | Disposition: A | Discharge status: Home / Self Care | Payer: Self-pay | Source: Home / Self Care | Attending: Family Medicine | Admitting: Family Medicine

## 2015-01-16 ENCOUNTER — Encounter (HOSPITAL_COMMUNITY): Payer: Self-pay | Admitting: Family Medicine

## 2015-01-16 DIAGNOSIS — K047 Periapical abscess without sinus: Secondary | ICD-10-CM

## 2015-01-16 DIAGNOSIS — L739 Follicular disorder, unspecified: Secondary | ICD-10-CM

## 2015-01-16 DIAGNOSIS — J069 Acute upper respiratory infection, unspecified: Secondary | ICD-10-CM

## 2015-01-16 LAB — POCT RAPID STREP A: STREPTOCOCCUS, GROUP A SCREEN (DIRECT): NEGATIVE

## 2015-01-16 MED ORDER — IPRATROPIUM BROMIDE 0.06 % NA SOLN: 2.0000 | Freq: Four times a day (QID) | NASAL | Status: DC

## 2015-01-16 MED ORDER — CLINDAMYCIN HCL 300 MG PO CAPS: 300.0000 mg | ORAL_CAPSULE | Freq: Three times a day (TID) | ORAL | Status: DC

## 2015-01-16 NOTE — ED Provider Notes (Signed)
CSN: [id number]     Arrival date & time 01/16/15  1039 History   None    Chief Complaint  Patient presents with  . Rash   (Consider location/radiation/quality/duration/timing/severity/associated sxs/prior Treatment) HPI  Started 5 days ago w/ stuffy nose and nasal congestion. Dayquil w/ some benefit. Mucinex w/ benefit. Now w/ fever.   Skin rash. Patient reports taking a very long with a new Dove body soak several days ago. After that patient developed rash on arms and trunk and legs. Described as large mosquito bites which sometimes have small pustules in the middle.  Broke off part of wisdom tooth. Broke during eating last week. Tender. Denies purulence, or bleeding. Gums minimally tender but without significant swelling.  Past Medical History  Diagnosis Date  . Asthma   . Head ache   . Hodgkin disease     as a child   Past Surgical History  Procedure Laterality Date  . Toe surgery     Family History  Problem Relation Age of Onset  . Cancer Maternal Grandmother   . Cancer Paternal Grandmother    History  Substance Use Topics  . Smoking status: Current Some Day Smoker -- 0.25 packs/day    Types: Cigarettes  . Smokeless tobacco: Not on file  . Alcohol Use: No   OB History    No data available     Review of Systems Per HPI with all other pertinent systems negative.   Allergies  Review of patient's allergies indicates no known allergies.  Home Medications   Prior to Admission medications   Medication Sig Start Date End Date Taking? Authorizing Provider  clindamycin (CLEOCIN) 300 MG capsule Take 1 capsule (300 mg total) by mouth 3 (three) times daily. 01/16/15   Waldemar Dickens, MD  ipratropium (ATROVENT) 0.06 % nasal spray Place 2 sprays into both nostrils 4 (four) times daily. 01/16/15   Waldemar Dickens, MD   BP 153/97 mmHg  Pulse 67  Temp(Src) 97.4 F (36.3 C) (Oral)  Resp 14  SpO2 100% Physical Exam Physical Exam  Constitutional: oriented to person,  place, and time. appears well-developed and well-nourished. No distress.  HENT:  Tonsils 1+ without exudate, pharyngeal cobblestoning Right axillary most posterior molar cavitated broken off in multiple places. Surrounding gum tenderness. No fluctuance or significant induration. No purulence or bleeding noted. Head: Normocephalic and atraumatic.  Eyes: EOMI. PERRL.  Neck: Normal range of motion.  Cardiovascular: RRR, no m/r/g, 2+ distal pulses,  Pulmonary/Chest: Effort normal and breath sounds normal. No respiratory distress.  Abdominal: Soft. Bowel sounds are normal. NonTTP, no distension.  Musculoskeletal: Normal range of motion. Non ttp, no effusion.  Neurological: alert and oriented to person, place, and time.  Skin: numerous skin lesions on arms, trunk, legs which appear to be macular and papular with intermittent pustules.Marland Kitchen  Psychiatric: normal mood and affect. behavior is normal. Judgment and thought content normal.   ED Course  Procedures (including critical care time) Labs Review Labs Reviewed - No data to display  Imaging Review No results found.   MDM   1. Folliculitis   2. Dental infection   3. Viral URI    Strep culture sent Start clindamycin for dental infection and folliculitis Nasal Atrovent, Zyrtec, NSAIDs Daily mouthwash Change to a antibacterial soap  Precautions given and all questions answered  Linna Darner, MD Family Medicine 01/16/2015, 12:14 PM      Waldemar Dickens, MD 01/16/15 1214

## 2015-01-16 NOTE — Discharge Instructions (Signed)
Please start the antibiotic for the dental infection as well as for the skin infection. The skin infection is called folliculitis. Please make sure to use an antibacterial soap. Please use a daily mouthwash. Please use the nasal Atrovent to help with the nasal congestion. Please also take a daily allergy pill such as Zyrtec.

## 2015-01-16 NOTE — ED Notes (Signed)
Multiple complaints.  Itchy rash to broken tooth

## 2015-01-19 ENCOUNTER — Ambulatory Visit: Payer: Self-pay | Attending: Family Medicine | Admitting: Family Medicine

## 2015-01-19 ENCOUNTER — Encounter: Payer: Self-pay | Admitting: Family Medicine

## 2015-01-19 VITALS — BP 115/71 | HR 70 | Temp 98.4°F | Resp 16 | Ht 63.0 in | Wt 138.0 lb

## 2015-01-19 DIAGNOSIS — K0889 Other specified disorders of teeth and supporting structures: Secondary | ICD-10-CM

## 2015-01-19 DIAGNOSIS — R21 Rash and other nonspecific skin eruption: Secondary | ICD-10-CM | POA: Insufficient documentation

## 2015-01-19 DIAGNOSIS — Z114 Encounter for screening for human immunodeficiency virus [HIV]: Secondary | ICD-10-CM | POA: Insufficient documentation

## 2015-01-19 DIAGNOSIS — F17200 Nicotine dependence, unspecified, uncomplicated: Secondary | ICD-10-CM | POA: Insufficient documentation

## 2015-01-19 DIAGNOSIS — K088 Other specified disorders of teeth and supporting structures: Secondary | ICD-10-CM

## 2015-01-19 DIAGNOSIS — Z720 Tobacco use: Secondary | ICD-10-CM

## 2015-01-19 LAB — CBC
HCT: 33.8 % — ABNORMAL LOW (ref 36.0–46.0)
HEMOGLOBIN: 11.1 g/dL — AB (ref 12.0–15.0)
MCH: 28.6 pg (ref 26.0–34.0)
MCHC: 32.8 g/dL (ref 30.0–36.0)
MCV: 87.1 fL (ref 78.0–100.0)
MPV: 9.9 fL (ref 8.6–12.4)
Platelets: 375 10*3/uL (ref 150–400)
RBC: 3.88 MIL/uL (ref 3.87–5.11)
RDW: 15 % (ref 11.5–15.5)
WBC: 11 10*3/uL — ABNORMAL HIGH (ref 4.0–10.5)

## 2015-01-19 MED ORDER — CETIRIZINE HCL 10 MG PO TABS: 10.0000 mg | ORAL_TABLET | Freq: Every day | ORAL | Status: DC

## 2015-01-19 NOTE — Progress Notes (Signed)
Establish care Cold Sx x 2 weeks, getting better. Tooth pain taking antibiotic need dental referral  Rash on body x 4 days.

## 2015-01-19 NOTE — Assessment & Plan Note (Signed)
Screening HIV done today  

## 2015-01-19 NOTE — Assessment & Plan Note (Signed)
Skin rash: Appears to be an allergy Plan: CBC, CMP- to rule out liver disease and infection as reason for rash and itching.  Start zyrtec

## 2015-01-19 NOTE — Assessment & Plan Note (Signed)
Dental pain:  Dental referral placed today.  Finish clindamycin.

## 2015-01-19 NOTE — Progress Notes (Signed)
   Subjective:    Patient ID: Dawn Foster, female    DOB: 12-27-1970, 44 y.o.   MRN: [id number] CC; skin rash, dental pain  HPI  1. Skin rash: started 3 weeks ago at the same time as a URI. URI almost resolved. Still with rash. Rash is erythematous papules on abdomen, arms and legs. Associated with tenderness and itching. No new skin products, travel, unusual foods or close contacts with rash. No mouth or eye lesions.   2. Dental pain: poor dentition for many years. R upper molar pain. No fever. On clindamycin for past 3 days. Requesting dental referral.   Soc Hx: current smoker  Review of Systems  Constitutional: Negative for fever and chills.  Respiratory: Negative for chest tightness and shortness of breath.   Cardiovascular: Negative for chest pain and palpitations.  Gastrointestinal: Positive for abdominal pain.  Musculoskeletal: Negative.   Skin: Positive for color change and rash. Negative for wound.      Objective:   Physical Exam BP 115/71 mmHg  Pulse 70  Temp(Src) 98.4 F (36.9 C) (Oral)  Resp 16  Ht 5\' 3"  (1.6 m)  Wt 138 lb (62.596 kg)  BMI 24.45 kg/m2  SpO2 70% General appearance: alert, cooperative and no distress Throat: normal oropharynx. Poor dentition, broken R upper molar with cavitation.  Lungs: clear to auscultation bilaterally Heart: regular rate and rhythm, S1, S2 normal, no murmur, click, rub or gallop Abdomen: soft, non-tender; bowel sounds normal; no masses,  no organomegaly Skin: erythematous papules on arms, legs and abdomen. They are tender. No lesions on hands or feet. No oral lesions.      Assessment & Plan:

## 2015-01-19 NOTE — Patient Instructions (Signed)
Dawn Foster,  Thank you for coming in today.  1. Dental pain:  Dental referral placed today.  Finish clindamycin.    2. Skin rash: Appears to be an allergy Plan: CBC, CMP- to rule out liver disease and infection as reason for rash and itching.  Start zyrtec   3. Screening for HIV: blood test to be done today with today's blood.  4. Screening for cervical cancer: pap smear will be done at next visit.  F/u in 3-4 weeks for pap smear and rash follow up  Dr. Adrian Blackwater

## 2015-01-20 LAB — COMPLETE METABOLIC PANEL WITH GFR
ALBUMIN: 4.4 g/dL (ref 3.5–5.2)
ALK PHOS: 82 U/L (ref 39–117)
ALT: 17 U/L (ref 0–35)
AST: 14 U/L (ref 0–37)
BUN: 14 mg/dL (ref 6–23)
CO2: 25 mEq/L (ref 19–32)
Calcium: 9.4 mg/dL (ref 8.4–10.5)
Chloride: 108 mEq/L (ref 96–112)
Creat: 0.96 mg/dL (ref 0.50–1.10)
GFR, EST AFRICAN AMERICAN: 84 mL/min
GFR, EST NON AFRICAN AMERICAN: 73 mL/min
Glucose, Bld: 74 mg/dL (ref 70–99)
POTASSIUM: 4.6 meq/L (ref 3.5–5.3)
SODIUM: 143 meq/L (ref 135–145)
Total Bilirubin: 0.2 mg/dL (ref 0.2–1.2)
Total Protein: 6.9 g/dL (ref 6.0–8.3)

## 2015-01-20 LAB — CULTURE, GROUP A STREP

## 2015-01-20 LAB — HIV ANTIBODY (ROUTINE TESTING W REFLEX): HIV: NONREACTIVE

## 2015-01-23 ENCOUNTER — Telehealth (HOSPITAL_COMMUNITY): Payer: Self-pay | Admitting: *Deleted

## 2015-01-23 NOTE — ED Notes (Addendum)
Throat culture: Strep beta hemolytic not group A.  Pt. adequately treated with Cleocin.  I called pt. and left a message to call.  Call 1. Roselyn Meier 01/23/2015 Pt. called back.  Pt. verified x 2 and given result.  Pt. told she was adequately treated with Cleocin and to finish all of medication.  Come back if not better. 01/23/2015
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
## 2015-05-17 ENCOUNTER — Ambulatory Visit: Payer: Self-pay | Admitting: Family Medicine

## 2016-01-16 ENCOUNTER — Ambulatory Visit: Payer: Self-pay | Attending: Family Medicine

## 2016-01-25 ENCOUNTER — Telehealth: Payer: Self-pay | Admitting: Family Medicine

## 2016-01-25 NOTE — Telephone Encounter (Signed)
Patient needs her referral for the dentist sent to [facility] again. Please f/u

## 2016-01-25 NOTE — Telephone Encounter (Signed)
Done. Thank you.

## 2016-03-27 MED FILL — AMOXICILLIN 500 MG CAPSULE: 500 | 7 days supply | Qty: 21 | Fill #0

## 2016-06-09 ENCOUNTER — Ambulatory Visit: Payer: Self-pay | Attending: Family Medicine | Admitting: Family Medicine

## 2016-06-09 ENCOUNTER — Encounter: Payer: Self-pay | Admitting: Family Medicine

## 2016-06-09 ENCOUNTER — Telehealth: Payer: Self-pay

## 2016-06-09 VITALS — BP 143/83 | HR 71 | Temp 98.2°F | Ht 63.0 in | Wt 132.4 lb

## 2016-06-09 DIAGNOSIS — K219 Gastro-esophageal reflux disease without esophagitis: Secondary | ICD-10-CM

## 2016-06-09 DIAGNOSIS — M545 Low back pain, unspecified: Secondary | ICD-10-CM | POA: Insufficient documentation

## 2016-06-09 DIAGNOSIS — E28319 Asymptomatic premature menopause: Secondary | ICD-10-CM

## 2016-06-09 DIAGNOSIS — A499 Bacterial infection, unspecified: Secondary | ICD-10-CM

## 2016-06-09 DIAGNOSIS — R030 Elevated blood-pressure reading, without diagnosis of hypertension: Secondary | ICD-10-CM

## 2016-06-09 DIAGNOSIS — B9689 Other specified bacterial agents as the cause of diseases classified elsewhere: Secondary | ICD-10-CM

## 2016-06-09 DIAGNOSIS — I10 Essential (primary) hypertension: Secondary | ICD-10-CM | POA: Insufficient documentation

## 2016-06-09 DIAGNOSIS — IMO0001 Reserved for inherently not codable concepts without codable children: Secondary | ICD-10-CM

## 2016-06-09 DIAGNOSIS — N760 Acute vaginitis: Secondary | ICD-10-CM

## 2016-06-09 DIAGNOSIS — Z01419 Encounter for gynecological examination (general) (routine) without abnormal findings: Secondary | ICD-10-CM | POA: Insufficient documentation

## 2016-06-09 DIAGNOSIS — Z862 Personal history of diseases of the blood and blood-forming organs and certain disorders involving the immune mechanism: Secondary | ICD-10-CM

## 2016-06-09 DIAGNOSIS — B351 Tinea unguium: Secondary | ICD-10-CM

## 2016-06-09 DIAGNOSIS — Z124 Encounter for screening for malignant neoplasm of cervix: Secondary | ICD-10-CM

## 2016-06-09 LAB — CBC
HCT: 35.2 % (ref 35.0–45.0)
HEMOGLOBIN: 11.5 g/dL — AB (ref 11.7–15.5)
MCH: 28.7 pg (ref 27.0–33.0)
MCHC: 32.7 g/dL (ref 32.0–36.0)
MCV: 87.8 fL (ref 80.0–100.0)
MPV: 10.1 fL (ref 7.5–12.5)
Platelets: 275 10*3/uL (ref 140–400)
RBC: 4.01 MIL/uL (ref 3.80–5.10)
RDW: 15 % (ref 11.0–15.0)
WBC: 7.7 10*3/uL (ref 3.8–10.8)

## 2016-06-09 LAB — COMPLETE METABOLIC PANEL WITHOUT GFR
ALT: 18 U/L (ref 6–29)
AST: 14 U/L (ref 10–30)
Albumin: 4.4 g/dL (ref 3.6–5.1)
Alkaline Phosphatase: 64 U/L (ref 33–115)
BUN: 12 mg/dL (ref 7–25)
CO2: 25 mmol/L (ref 20–31)
Calcium: 9.5 mg/dL (ref 8.6–10.2)
Chloride: 107 mmol/L (ref 98–110)
Creat: 0.97 mg/dL (ref 0.50–1.10)
GFR, Est African American: 83 mL/min
GFR, Est Non African American: 72 mL/min
Glucose, Bld: 89 mg/dL (ref 65–99)
Potassium: 4.1 mmol/L (ref 3.5–5.3)
Sodium: 139 mmol/L (ref 135–146)
Total Bilirubin: 0.3 mg/dL (ref 0.2–1.2)
Total Protein: 6.7 g/dL (ref 6.1–8.1)

## 2016-06-09 LAB — FERRITIN: Ferritin: 82 ng/mL (ref 10–232)

## 2016-06-09 MED ORDER — CALCIUM CITRATE 200 MG PO TABS: 400.0000 mg | ORAL_TABLET | Freq: Every day | ORAL | 3 refills | Status: DC

## 2016-06-09 MED ORDER — RANITIDINE HCL 150 MG PO TABS: 150.0000 mg | ORAL_TABLET | Freq: Two times a day (BID) | ORAL | 1 refills | Status: DC

## 2016-06-09 MED ORDER — TERBINAFINE HCL 250 MG PO TABS: 250.0000 mg | ORAL_TABLET | Freq: Every day | ORAL | 2 refills | Status: DC

## 2016-06-09 MED ORDER — VITAMIN D3 50 MCG (2000 UT) PO TABS: 2000.0000 [IU] | ORAL_TABLET | Freq: Every day | ORAL | 3 refills | Status: DC

## 2016-06-09 MED FILL — TERBINAFINE HCL 250 MG TAB: 250 | 30 days supply | Qty: 30 | Fill #0

## 2016-06-09 MED FILL — raNITIdine HCL 150 MG TABS: 150 | 30 days supply | Qty: 60 | Fill #0

## 2016-06-09 NOTE — Progress Notes (Signed)
SUBJECTIVE:  45 y.o. female for annual routine Pap and wellness physical. She also complains and heartburn and back pain. She denies regular ETOH. She is smoking some days of the week and ready to quit   Social History  Substance Use Topics  . Smoking status: Current Some Day Smoker    Packs/day: 0.25    Types: Cigarettes  . Smokeless tobacco: Not on file  . Alcohol use No   Current Outpatient Prescriptions  Medication Sig Dispense Refill  . cetirizine (ZYRTEC) 10 MG tablet Take 1 tablet (10 mg total) by mouth daily. 30 tablet 2  . clindamycin (CLEOCIN) 300 MG capsule Take 1 capsule (300 mg total) by mouth 3 (three) times daily. (Patient not taking: Reported on 06/09/2016) 21 capsule 0   No current facility-administered medications for this visit.    Allergies: Review of patient's allergies indicates no known allergies.  No LMP recorded. Patient is postmenopausal.  ROS:  Feeling well. No dyspnea or chest pain on exertion.  No abdominal pain, change in bowel habits, black or bloody stools.  No urinary tract symptoms. GYN ROS: normal menses, no abnormal bleeding, pelvic pain or discharge, no breast pain or new or enlarging lumps on self exam. No neurological complaints.  OBJECTIVE:  The patient appears well, alert, oriented x 3, in no distress. BP (!) 143/83 (BP Location: Left Arm, Patient Position: Sitting, Cuff Size: Small)   Pulse 71   Temp 98.2 F (36.8 C) (Oral)   Ht 5\' 3"  (1.6 m)   Wt 132 lb 6.4 oz (60.1 kg)   SpO2 97%   BMI 23.45 kg/m   BP Readings from Last 3 Encounters:  06/09/16 (!) 143/83  01/19/15 115/71  01/16/15 153/97    ENT normal.  Neck supple. No adenopathy or thyromegaly. PERLA. Lungs are clear, good air entry, no wheezes, rhonchi or rales. S1 and S2 normal, no murmurs, regular rate and rhythm. Abdomen soft without tenderness, guarding, mass or organomegaly. Extremities show no edema, normal peripheral pulses. Neurological is normal, no focal  findings.  BREAST EXAM: breasts appear normal, no suspicious masses, no skin or nipple changes or axillary nodes  PELVIC EXAM: normal external genitalia, vulva, vagina, cervix, uterus and adnexa. Scant white discharge in vagina   ASSESSMENT:  well woman  PLAN:  mammogram pap smear Return in 6 weeks for BP check, low back pain, f;u GERD since starting zantac

## 2016-06-09 NOTE — Patient Instructions (Addendum)
Jen was seen today for annual exam.  Diagnoses and all orders for this visit:  Papanicolaou smear for cervical cancer screening -     Cytology - PAP  Early menopause occurring in patient age younger than 21 years -     Cholecalciferol (VITAMIN D3) 2000 units TABS; Take 2,000 Units by mouth daily. -     Calcium Citrate 200 MG TABS; Take 2 tablets (400 mg total) by mouth daily. -     Vitamin D, 25-hydroxy  Gastroesophageal reflux disease, esophagitis presence not specified -     ranitidine (ZANTAC) 150 MG tablet; Take 1 tablet (150 mg total) by mouth 2 (two) times daily.  Bilateral low back pain, with sciatica presence unspecified  Elevated BP  Onychomycosis of toenail -     COMPLETE METABOLIC PANEL WITH GFR -     terbinafine (LAMISIL) 250 MG tablet; Take 1 tablet (250 mg total) by mouth daily. For 3 months  History of anemia -     CBC -     Ferritin  Smoking cessation support: smoking cessation hotline: 1-800-QUIT-NOW.  Smoking cessation classes are available through [facility]. Call 302-413-1120 or visit our website at [email or web address]   Take calcium,vit D and exercise to prevent osteoporosis in setting of early menopause   F/u in 6 weeks for BP check, f/u GERD since starting zantac and to address low back pain  and flu shot   Dr. Adrian Blackwater

## 2016-06-09 NOTE — Progress Notes (Signed)
C/C: patient states she is having heartburn real bad, patient also states she is having back pain.

## 2016-06-09 NOTE — Telephone Encounter (Signed)
Patient was informed of results 

## 2016-06-10 ENCOUNTER — Telehealth: Payer: Self-pay | Admitting: Family Medicine

## 2016-06-10 DIAGNOSIS — E559 Vitamin D deficiency, unspecified: Secondary | ICD-10-CM

## 2016-06-10 LAB — CYTOLOGY - PAP

## 2016-06-10 LAB — VITAMIN D 25 HYDROXY (VIT D DEFICIENCY, FRACTURES): VIT D 25 HYDROXY: 12 ng/mL — AB (ref 30–100)

## 2016-06-10 LAB — CERVICOVAGINAL ANCILLARY ONLY: WET PREP (BD AFFIRM): POSITIVE — AB

## 2016-06-10 MED ORDER — VITAMIN D (ERGOCALCIFEROL) 1.25 MG (50000 UNIT) PO CAPS: 50000.0000 [IU] | ORAL_CAPSULE | ORAL | 2 refills | Status: DC

## 2016-06-10 MED ORDER — METRONIDAZOLE 500 MG PO TABS: 500.0000 mg | ORAL_TABLET | Freq: Two times a day (BID) | ORAL | 0 refills | Status: DC

## 2016-06-10 MED ORDER — FLUCONAZOLE 150 MG PO TABS: 150.0000 mg | ORAL_TABLET | Freq: Once | ORAL | 0 refills | Status: AC

## 2016-06-10 MED FILL — VIT D2 1.25 MG (50,000 UNIT: 1.25 MG | 84 days supply | Qty: 12 | Fill #0

## 2016-06-10 NOTE — Addendum Note (Signed)
Addended by: Boykin Nearing on: 06/10/2016 05:39 PM   Modules accepted: Orders

## 2016-06-10 NOTE — Telephone Encounter (Signed)
Vit D is low  Just slight anemia with normal ferritin suggesting normal iron stores  Take high dose vit D 50000 IU weekly for 12 weeks

## 2016-06-11 ENCOUNTER — Telehealth: Payer: Self-pay

## 2016-06-11 MED FILL — FLUCONAZOLE 150 MG TABLET: 150 | 1 days supply | Qty: 1 | Fill #0

## 2016-06-11 MED FILL — metroNIDAZOLE 500 MG TABS: 500 | 7 days supply | Qty: 14 | Fill #0

## 2016-06-11 NOTE — Telephone Encounter (Signed)
Patient was informed of results and scripts being sent to the pharmacy.

## 2016-06-11 NOTE — Telephone Encounter (Signed)
Patient was informed of VIT D levels and meds being sent to her pharmacy.

## 2017-03-25 ENCOUNTER — Ambulatory Visit: Payer: Self-pay | Attending: Family Medicine | Admitting: Family Medicine

## 2017-03-25 VITALS — BP 140/82 | HR 78 | Temp 98.4°F | Resp 16 | Wt 136.6 lb

## 2017-03-25 DIAGNOSIS — Z131 Encounter for screening for diabetes mellitus: Secondary | ICD-10-CM | POA: Insufficient documentation

## 2017-03-25 DIAGNOSIS — E559 Vitamin D deficiency, unspecified: Secondary | ICD-10-CM | POA: Insufficient documentation

## 2017-03-25 DIAGNOSIS — Z79899 Other long term (current) drug therapy: Secondary | ICD-10-CM | POA: Insufficient documentation

## 2017-03-25 DIAGNOSIS — I10 Essential (primary) hypertension: Secondary | ICD-10-CM | POA: Insufficient documentation

## 2017-03-25 DIAGNOSIS — M940 Chondrocostal junction syndrome [Tietze]: Secondary | ICD-10-CM | POA: Insufficient documentation

## 2017-03-25 DIAGNOSIS — Z23 Encounter for immunization: Secondary | ICD-10-CM

## 2017-03-25 DIAGNOSIS — Z8639 Personal history of other endocrine, nutritional and metabolic disease: Secondary | ICD-10-CM

## 2017-03-25 DIAGNOSIS — R7303 Prediabetes: Secondary | ICD-10-CM | POA: Insufficient documentation

## 2017-03-25 DIAGNOSIS — Z0000 Encounter for general adult medical examination without abnormal findings: Secondary | ICD-10-CM | POA: Insufficient documentation

## 2017-03-25 LAB — POCT GLYCOSYLATED HEMOGLOBIN (HGB A1C): Hemoglobin A1C: 5.7

## 2017-03-25 LAB — POCT UA - MICROALBUMIN
Albumin/Creatinine Ratio, Urine, POC: <30
CREATININE, POC: 300 mg/dL
MICROALBUMIN (UR) POC: 10 mg/L

## 2017-03-25 MED ORDER — NAPROXEN 500 MG PO TABS: 500.0000 mg | ORAL_TABLET | Freq: Two times a day (BID) | ORAL | 1 refills | Status: DC

## 2017-03-25 MED ORDER — METHOCARBAMOL 500 MG PO TABS: 500.0000 mg | ORAL_TABLET | Freq: Three times a day (TID) | ORAL | 0 refills | Status: DC | PRN

## 2017-03-25 MED ORDER — HYDROCHLOROTHIAZIDE 12.5 MG PO TABS: 12.5000 mg | ORAL_TABLET | Freq: Every day | ORAL | 2 refills | Status: DC

## 2017-03-25 MED FILL — METHOCARBAMOL 500 MG TABLET: 500 | 10 days supply | Qty: 30 | Fill #0

## 2017-03-25 MED FILL — NAPROXEN 500 MG TABLET: 500 | 15 days supply | Qty: 30 | Fill #0

## 2017-03-25 MED FILL — ?HYDROCHLOROTHIAZIDE 12.5MG: 12.5 | 30 days supply | Qty: 30 | Fill #0

## 2017-03-25 NOTE — Progress Notes (Signed)
Subjective:  Patient ID: Dawn Foster, female    DOB: [birth year]  Age: 46 y.o. MRN: [id number]  CC: Abdominal Pain  HPI   Dawn Foster presents for right sided rib cage pain. Patient complains of myalgias for which has been present for 2 weeks. Pain is located in right lateral rib cage, is described as aching and knot like, and is intermittent .  Symptoms are worsened by deep breathing. She denies any fevers, cough, or hemoptysis.The patient has tried nothing for pain relief.  She denies any injury. She does report history of moving and lifting times 2 weeks ago. History of elevated BP's. . She is not exercising and is not adherent to low salt diet.  Blood pressure SHe does not check BP at home. well controlled at home. Cardiac symptoms none. Patient denies chest pain, chest pressure/discomfort, claudication, dyspnea, lower extremity edema, near-syncope and palpitations.  Cardiovascular risk factors: hypertension, sedentary lifestyle and smoking/ tobacco exposure. She smokes 2 to 3 cigarettes per day for 10 yeas. Use of agents associated with hypertension: none. History of target organ damage: none.   Outpatient Medications Prior to Visit  Medication Sig Dispense Refill  . Calcium Citrate 200 MG TABS Take 2 tablets (400 mg total) by mouth daily. (Patient not taking: Reported on 03/25/2017) 180 tablet 3  . cetirizine (ZYRTEC) 10 MG tablet Take 1 tablet (10 mg total) by mouth daily. (Patient not taking: Reported on 03/25/2017) 30 tablet 2  . Cholecalciferol (VITAMIN D3) 2000 units TABS Take 2,000 Units by mouth daily. 90 tablet 3  . metroNIDAZOLE (FLAGYL) 500 MG tablet Take 1 tablet (500 mg total) by mouth 2 (two) times daily. (Patient not taking: Reported on 03/25/2017) 14 tablet 0  . ranitidine (ZANTAC) 150 MG tablet Take 1 tablet (150 mg total) by mouth 2 (two) times daily. (Patient not taking: Reported on 03/25/2017) 60 tablet 1  . terbinafine (LAMISIL) 250 MG tablet Take 1 tablet (250 mg total) by  mouth daily. For 3 months (Patient not taking: Reported on 03/25/2017) 30 tablet 2  . Vitamin D, Ergocalciferol, (DRISDOL) 50000 units CAPS capsule Take 1 capsule (50,000 Units total) by mouth every 7 (seven) days. For 12 weeks (Patient not taking: Reported on 03/25/2017) 4 capsule 2   No facility-administered medications prior to visit.     ROS Review of Systems  Constitutional: Negative.   Eyes: Negative.   Respiratory: Negative.   Cardiovascular: Negative.   Musculoskeletal: Positive for myalgias (RUQ lataeral ribcage).  Psychiatric/Behavioral: Negative.    Objective:  BP 140/82   Pulse 78   Temp 98.4 F (36.9 C) (Oral)   Resp 16   Wt 136 lb 9.6 oz (62 kg)   SpO2 97%   BMI 24.20 kg/m   BP/Weight 03/25/2017 1/76/1607 12/24/1060  Systolic BP 694 854 627  Diastolic BP 82 83 71  Wt. (Lbs) 136.6 132.4 138  BMI 24.2 23.45 24.45    Physical Exam  Constitutional: She appears well-developed and well-nourished.  Eyes: Conjunctivae are normal. Pupils are equal, round, and reactive to light.  Neck: No JVD present.  Cardiovascular: Normal rate, regular rhythm, normal heart sounds and intact distal pulses.   Pulmonary/Chest: Effort normal and breath sounds normal.  Abdominal: Soft. Bowel sounds are normal.  Musculoskeletal:  RUQ lateral ribcage pain. Reproducible with palpation.   Skin: Skin is warm and dry.  Psychiatric: She has a normal mood and affect.  Nursing note and vitals reviewed.  Assessment & Plan:   Problem  List Items Addressed This Visit      Other   Diabetes mellitus screening   Relevant Orders   POCT glycosylated hemoglobin (Hb A1C) (Completed)   Healthcare maintenance    Other Visit Diagnoses    Acute costochondritis    -  Primary   Relevant Medications   naproxen (NAPROSYN) 500 MG tablet   methocarbamol (ROBAXIN) 500 MG tablet   Hypertension, unspecified type       Schedule BP recheck in 2 weeks with clinic RN   If BP is greater than 90/60 (MAP 65 or  greater) but not less than 130/80 may increase dose of    HTCZ to 25 mg QD and recheck in another 2 weeks with clinic RN.    Follow up with PCP in 3 months.   Relevant Medications   hydrochlorothiazide (HYDRODIURIL) 12.5 MG tablet   Other Relevant Orders   CMP14+EGFR (Completed)   POCT UA - Microalbumin (Completed)   Lipid Panel   Prediabetes       Implement lifestyle changes. Recheck HgbA1c again in 3 months.    Relevant Orders   POCT glycosylated hemoglobin (Hb A1C) (Completed)   History of vitamin D deficiency       Relevant Orders   Vitamin D, 25-hydroxy (Completed)      Meds ordered this encounter  Medications  . hydrochlorothiazide (HYDRODIURIL) 12.5 MG tablet    Sig: Take 1 tablet (12.5 mg total) by mouth daily.    Dispense:  30 tablet    Refill:  2    Order Specific Question:   Supervising Provider    Answer:   Tresa Garter [id number]  . naproxen (NAPROSYN) 500 MG tablet    Sig: Take 1 tablet (500 mg total) by mouth 2 (two) times daily with a meal.    Dispense:  30 tablet    Refill:  1    Order Specific Question:   Supervising Provider    Answer:   Tresa Garter [id number]  . methocarbamol (ROBAXIN) 500 MG tablet    Sig: Take 1 tablet (500 mg total) by mouth every 8 (eight) hours as needed for muscle spasms.    Dispense:  30 tablet    Refill:  0    Order Specific Question:   Supervising Provider    Answer:   Tresa Garter [[id number]]    Follow-up: Return in about 2 weeks (around 04/08/2017), or if symptoms worsen or fail to improve, for BP check with clinic RN.   Alfonse Spruce FNP

## 2017-03-25 NOTE — Patient Instructions (Addendum)
Costochondritis Costochondritis is swelling and irritation (inflammation) of the tissue (cartilage) that connects your ribs to your breastbone (sternum). This causes pain in the front of your chest. Usually, the pain:  Starts gradually.  Is in more than one rib.  This condition usually goes away on its own over time. Follow these instructions at home:  Do not do anything that makes your pain worse.  If directed, put ice on the painful area: ? Put ice in a plastic bag. ? Place a towel between your skin and the bag. ? Leave the ice on for 20 minutes, 2-3 times a day.  If directed, put heat on the affected area as often as told by your doctor. Use the heat source that your doctor tells you to use, such as a moist heat pack or a heating pad. ? Place a towel between your skin and the heat source. ? Leave the heat on for 20-30 minutes. ? Take off the heat if your skin turns bright red. This is very important if you cannot feel pain, heat, or cold. You may have a greater risk of getting burned.  Take over-the-counter and prescription medicines only as told by your doctor.  Return to your normal activities as told by your doctor. Ask your doctor what activities are safe for you.  Keep all follow-up visits as told by your doctor. This is important. Contact a doctor if:  You have chills or a fever.  Your pain does not go away or it gets worse.  You have a cough that does not go away. Get help right away if:  You are short of breath. This information is not intended to replace advice given to you by your health care provider. Make sure you discuss any questions you have with your health care provider. Document Released: 03/24/2008 Document Revised: 04/25/2016 Document Reviewed: 01/30/2016 Elsevier Interactive Patient Education  2018 [employer]. Prediabetes Prediabetes is the condition of having a blood sugar (blood glucose) level that is higher than it should be, but not high  enough for you to be diagnosed with type 2 diabetes. Having prediabetes puts you at risk for developing type 2 diabetes (type 2 diabetes mellitus). Prediabetes may be called impaired glucose tolerance or impaired fasting glucose. Prediabetes usually does not cause symptoms. Your health care provider can diagnose this condition with blood tests. You may be tested for prediabetes if you are overweight and if you have at least one other risk factor for prediabetes. Risk factors for prediabetes include:  Having a family member with type 2 diabetes.  Being overweight or obese.  Being older than age 15.  Being of American-Indian, African-American, Hispanic/Latino, or Asian/Pacific Islander descent.  Having an inactive (sedentary) lifestyle.  Having a history of gestational diabetes or polycystic ovarian syndrome (PCOS).  Having low levels of good cholesterol (HDL-C) or high levels of blood fats (triglycerides).  Having high blood pressure.  What is blood glucose and how is blood glucose measured?  Blood glucose refers to the amount of glucose in your bloodstream. Glucose comes from eating foods that contain sugars and starches (carbohydrates) that the body breaks down into glucose. Your blood glucose level may be measured in mg/dL (milligrams per deciliter) or mmol/L (millimoles per liter).Your blood glucose may be checked with one or more of the following blood tests:  A fasting blood glucose (FBG) test. You will not be allowed to eat (you will fast) for at least 8 hours before a blood sample is  taken. ? A normal range for FBG is 70-100 mg/dl (3.9-5.6 mmol/L).  An A1c (hemoglobin A1c) blood test. This test provides information about blood glucose control over the previous 2?55months.  An oral glucose tolerance test (OGTT). This test measures your blood glucose twice: ? After fasting. This is your baseline level. ? Two hours after you drink a beverage that contains glucose.  You may be  diagnosed with prediabetes:  If your FBG is 100?125 mg/dL (5.6-6.9 mmol/L).  If your A1c level is 5.7?6.4%.  If your OGGT result is 140?199 mg/dL (7.8-11 mmol/L).  These blood tests may be repeated to confirm your diagnosis. What happens if blood glucose is too high? The pancreas produces a hormone (insulin) that helps move glucose from the bloodstream into cells. When cells in the body do not respond properly to insulin that the body makes (insulin resistance), excess glucose builds up in the blood instead of going into cells. As a result, high blood glucose (hyperglycemia) can develop, which can cause many complications. This is a symptom of prediabetes. What can happen if blood glucose stays higher than normal for a long time? Having high blood glucose for a long time is dangerous. Too much glucose in your blood can damage your nerves and blood vessels. Long-term damage can lead to complications from diabetes, which may include:  Heart disease.  Stroke.  Blindness.  Kidney disease.  Depression.  Poor circulation in the feet and legs, which could lead to surgical removal (amputation) in severe cases.  How can prediabetes be prevented from turning into type 2 diabetes?  To help prevent type 2 diabetes, take the following actions:  Be physically active. ? Do moderate-intensity physical activity for at least 30 minutes on at least 5 days of the week, or as much as told by your health care provider. This could be brisk walking, biking, or water aerobics. ? Ask your health care provider what activities are safe for you. A mix of physical activities may be best, such as walking, swimming, cycling, and strength training.  Lose weight as told by your health care provider. ? Losing 5-7% of your body weight can reverse insulin resistance. ? Your health care provider can determine how much weight loss is best for you and can help you lose weight safely.  Follow a healthy meal plan. This  includes eating lean proteins, complex carbohydrates, fresh fruits and vegetables, low-fat dairy products, and healthy fats. ? Follow instructions from your health care provider about eating or drinking restrictions. ? Make an appointment to see a diet and nutrition specialist (registered dietitian) to help you create a healthy eating plan that is right for you.  Do not smoke or use any tobacco products, such as cigarettes, chewing tobacco, and e-cigarettes. If you need help quitting, ask your health care provider.  Take over-the-counter and prescription medicines as told by your health care provider. You may be prescribed medicines that help lower the risk of type 2 diabetes.  This information is not intended to replace advice given to you by your health care provider. Make sure you discuss any questions you have with your health care provider. Document Released: 01/28/2016 Document Revised: 03/13/2016 Document Reviewed: 11/27/2015 Elsevier Interactive Patient Education  2018 [employer].   Hypertension Hypertension is another name for high blood pressure. High blood pressure forces your heart to work harder to pump blood. This can cause problems over time. There are two numbers in a blood pressure reading. There is a top number (  systolic) over a bottom number (diastolic). It is best to have a blood pressure below 120/80. Healthy choices can help lower your blood pressure. You may need medicine to help lower your blood pressure if:  Your blood pressure cannot be lowered with healthy choices.  Your blood pressure is higher than 130/80.  Follow these instructions at home: Eating and drinking  If directed, follow the DASH eating plan. This diet includes: ? Filling half of your plate at each meal with fruits and vegetables. ? Filling one quarter of your plate at each meal with whole grains. Whole grains include whole wheat pasta, brown rice, and whole grain bread. ? Eating or drinking  low-fat dairy products, such as skim milk or low-fat yogurt. ? Filling one quarter of your plate at each meal with low-fat (lean) proteins. Low-fat proteins include fish, skinless chicken, eggs, beans, and tofu. ? Avoiding fatty meat, cured and processed meat, or chicken with skin. ? Avoiding premade or processed food.  Eat less than 1,500 mg of salt (sodium) a day.  Limit alcohol use to no more than 1 drink a day for nonpregnant women and 2 drinks a day for men. One drink equals 12 oz of beer, 5 oz of wine, or 1 oz of hard liquor. Lifestyle  Work with your doctor to stay at a healthy weight or to lose weight. Ask your doctor what the best weight is for you.  Get at least 30 minutes of exercise that causes your heart to beat faster (aerobic exercise) most days of the week. This may include walking, swimming, or biking.  Get at least 30 minutes of exercise that strengthens your muscles (resistance exercise) at least 3 days a week. This may include lifting weights or pilates.  Do not use any products that contain nicotine or tobacco. This includes cigarettes and e-cigarettes. If you need help quitting, ask your doctor.  Check your blood pressure at home as told by your doctor.  Keep all follow-up visits as told by your doctor. This is important. Medicines  Take over-the-counter and prescription medicines only as told by your doctor. Follow directions carefully.  Do not skip doses of blood pressure medicine. The medicine does not work as well if you skip doses. Skipping doses also puts you at risk for problems.  Ask your doctor about side effects or reactions to medicines that you should watch for. Contact a doctor if:  You think you are having a reaction to the medicine you are taking.  You have headaches that keep coming back (recurring).  You feel dizzy.  You have swelling in your ankles.  You have trouble with your vision. Get help right away if:  You get a very bad  headache.  You start to feel confused.  You feel weak or numb.  You feel faint.  You get very bad pain in your: ? Chest. ? Belly (abdomen).  You throw up (vomit) more than once.  You have trouble breathing. Summary  Hypertension is another name for high blood pressure.  Making healthy choices can help lower blood pressure. If your blood pressure cannot be controlled with healthy choices, you may need to take medicine. This information is not intended to replace advice given to you by your health care provider. Make sure you discuss any questions you have with your health care provider. Document Released: 03/24/2008 Document Revised: 09/03/2016 Document Reviewed: 09/03/2016 Elsevier Interactive Patient Education  2018 [place] (Tetanus and Diphtheria):  What You Need to Know 1. Why get vaccinated? Tetanus  and diphtheria are very serious diseases. They are rare in the [place] today, but people who do become infected often have severe complications. Td vaccine is used to protect adolescents and adults from both of these diseases. Both tetanus and diphtheria are infections caused by bacteria. Diphtheria spreads from person to person through coughing or sneezing. Tetanus-causing bacteria enter the body through cuts, scratches, or wounds. TETANUS (lockjaw) causes painful muscle tightening and stiffness, usually all over the body.  It can lead to tightening of muscles in the head and neck so you can't open your mouth, swallow, or sometimes even breathe. Tetanus kills about 1 out of every 10 people who are infected even after receiving the best medical care.  DIPHTHERIA can cause a thick coating to form in the back of the throat.  It can lead to breathing problems, paralysis, heart failure, and death.  Before vaccines, as many as 200,000 cases of diphtheria and hundreds of cases of tetanus were reported in the [place] each year. Since vaccination began,  reports of cases for both diseases have dropped by about 99%. 2. Td vaccine Td vaccine can protect adolescents and adults from tetanus and diphtheria. Td is usually given as a booster dose every 10 years but it can also be given earlier after a severe and dirty wound or burn. Another vaccine, called Tdap, which protects against pertussis in addition to tetanus and diphtheria, is sometimes recommended instead of Td vaccine. Your doctor or the person giving you the vaccine can give you more information. Td may safely be given at the same time as other vaccines. 3. Some people should not get this vaccine  A person who has ever had a life-threatening allergic reaction after a previous dose of any tetanus or diphtheria containing vaccine, OR has a severe allergy to any part of this vaccine, should not get Td vaccine. Tell the person giving the vaccine about any severe allergies.  Talk to your doctor if you: ? had severe pain or swelling after any vaccine containing diphtheria or tetanus, ? ever had a condition called Guillain Barre Syndrome (GBS), ? aren't feeling well on the day the shot is scheduled. 4. What are the risks from Td vaccine? With any medicine, including vaccines, there is a chance of side effects. These are usually mild and go away on their own. Serious reactions are also possible but are rare. Most people who get Td vaccine do not have any problems with it. Mild problems following Td vaccine: (Did not interfere with activities)  Pain where the shot was given (about 8 people in 10)  Redness or swelling where the shot was given (about 1 person in 4)  Mild fever (rare)  Headache (about 1 person in 4)  Tiredness (about 1 person in 4)  Moderate problems following Td vaccine: (Interfered with activities, but did not require medical attention)  Fever over 102F (rare)  Severe problems following Td vaccine: (Unable to perform usual activities; required medical  attention)  Swelling, severe pain, bleeding and/or redness in the arm where the shot was given (rare).  Problems that could happen after any vaccine:  People sometimes faint after a medical procedure, including vaccination. Sitting or lying down for about 15 minutes can help prevent fainting, and injuries caused by a fall. Tell your doctor if you feel dizzy, or have vision changes or ringing in the ears.  Some people get severe pain in the  shoulder and have difficulty moving the arm where a shot was given. This happens very rarely.  Any medication can cause a severe allergic reaction. Such reactions from a vaccine are very rare, estimated at fewer than 1 in a million doses, and would happen within a few minutes to a few hours after the vaccination. As with any medicine, there is a very remote chance of a vaccine causing a serious injury or death. The safety of vaccines is always being monitored. For more information, visit: http://www.aguilar.org/ 5. What if there is a serious reaction? What should I look for? Look for anything that concerns you, such as signs of a severe allergic reaction, very high fever, or unusual behavior. Signs of a severe allergic reaction can include hives, swelling of the face and throat, difficulty breathing, a fast heartbeat, dizziness, and weakness. These would usually start a few minutes to a few hours after the vaccination. What should I do?  If you think it is a severe allergic reaction or other emergency that can't wait, call 9-1-1 or get the person to the nearest hospital. Otherwise, call your doctor.  Afterward, the reaction should be reported to the Vaccine Adverse Event Reporting System (VAERS). Your doctor might file this report, or you can do it yourself through the VAERS web site at www.vaers.SamedayNews.es, or by calling (630)348-4991. ? VAERS does not give medical advice. 6. The National Vaccine Injury Compensation Program The [employer] Vaccine Injury  Compensation Program (VICP) is a federal program that was created to compensate people who may have been injured by certain vaccines. Persons who believe they may have been injured by a vaccine can learn about the program and about filing a claim by calling 5104537851 or visiting the [place] website at GoldCloset.com.ee. There is a time limit to file a claim for compensation. 7. How can I learn more?  Ask your doctor. He or she can give you the vaccine package insert or suggest other sources of information.  Call your local or state health department.  Contact the Centers for Disease Control and Prevention (CDC): ? Call 501-529-3310 (1-800-CDC-INFO) ? Visit CDC's website at http://hunter.com/ CDC Td Vaccine VIS (01/29/16) This information is not intended to replace advice given to you by your health care provider. Make sure you discuss any questions you have with your health care provider. Document Released: 08/03/2006 Document Revised: 06/26/2016 Document Reviewed: 06/26/2016 Elsevier Interactive Patient Education  2017 [place].  Hydrochlorothiazide, HCTZ capsules or tablets What is this medicine? HYDROCHLOROTHIAZIDE (hye droe klor oh THYE a zide) is a diuretic. It increases the amount of urine passed, which causes the body to lose salt and water. This medicine is used to treat high blood pressure. It is also reduces the swelling and water retention caused by various medical conditions, such as heart, liver, or kidney disease. This medicine may be used for other purposes; ask your health care provider or pharmacist if you have questions. COMMON BRAND NAME(S): Esidrix, Ezide, HydroDIURIL, Microzide, Oretic, Zide What should I tell my health care provider before I take this medicine? They need to know if you have any of these conditions: -diabetes -gout -immune system problems, like lupus -kidney disease or kidney stones -liver disease -pancreatitis -small amount  of urine or difficulty passing urine -an unusual or allergic reaction to hydrochlorothiazide, sulfa drugs, other medicines, foods, dyes, or preservatives -pregnant or trying to get pregnant -breast-feeding How should I use this medicine? Take this medicine by mouth with a glass of water. Follow the  directions on the prescription label. Take your medicine at regular intervals. Remember that you will need to pass urine frequently after taking this medicine. Do not take your doses at a time of day that will cause you problems. Do not stop taking your medicine unless your doctor tells you to. Talk to your pediatrician regarding the use of this medicine in children. Special care may be needed. Overdosage: If you think you have taken too much of this medicine contact a poison control center or emergency room at once. NOTE: This medicine is only for you. Do not share this medicine with others. What if I miss a dose? If you miss a dose, take it as soon as you can. If it is almost time for your next dose, take only that dose. Do not take double or extra doses. What may interact with this medicine? -cholestyramine -colestipol -digoxin -dofetilide -lithium -medicines for blood pressure -medicines for diabetes -medicines that relax muscles for surgery -other diuretics -steroid medicines like prednisone or cortisone This list may not describe all possible interactions. Give your health care provider a list of all the medicines, herbs, non-prescription drugs, or dietary supplements you use. Also tell them if you smoke, drink alcohol, or use illegal drugs. Some items may interact with your medicine. What should I watch for while using this medicine? Visit your doctor or health care professional for regular checks on your progress. Check your blood pressure as directed. Ask your doctor or health care professional what your blood pressure should be and when you should contact him or her. You may need to be  on a special diet while taking this medicine. Ask your doctor. Check with your doctor or health care professional if you get an attack of severe diarrhea, nausea and vomiting, or if you sweat a lot. The loss of too much body fluid can make it dangerous for you to take this medicine. You may get drowsy or dizzy. Do not drive, use machinery, or do anything that needs mental alertness until you know how this medicine affects you. Do not stand or sit up quickly, especially if you are an older patient. This reduces the risk of dizzy or fainting spells. Alcohol may interfere with the effect of this medicine. Avoid alcoholic drinks. This medicine may affect your blood sugar level. If you have diabetes, check with your doctor or health care professional before changing the dose of your diabetic medicine. This medicine can make you more sensitive to the sun. Keep out of the sun. If you cannot avoid being in the sun, wear protective clothing and use sunscreen. Do not use sun lamps or tanning beds/booths. What side effects may I notice from receiving this medicine? Side effects that you should report to your doctor or health care professional as soon as possible: -allergic reactions such as skin rash or itching, hives, swelling of the lips, mouth, tongue, or throat -changes in vision -chest pain -eye pain -fast or irregular heartbeat -feeling faint or lightheaded, falls -gout attack -muscle pain or cramps -pain or difficulty when passing urine -pain, tingling, numbness in the hands or feet -redness, blistering, peeling or loosening of the skin, including inside the mouth -unusually weak or tired Side effects that usually do not require medical attention (report to your doctor or health care professional if they continue or are bothersome): -change in sex drive or performance -dry mouth -headache -stomach upset This list may not describe all possible side effects. Call your doctor for medical advice  about side effects. You may report side effects to FDA at 1-800-FDA-1088. Where should I keep my medicine? Keep out of the reach of children. Store at room temperature between 15 and 30 degrees C (59 and 86 degrees F). Do not freeze. Protect from light and moisture. Keep container closed tightly. Throw away any unused medicine after the expiration date. NOTE: This sheet is a summary. It may not cover all possible information. If you have questions about this medicine, talk to your doctor, pharmacist, or health care provider.  2018 Elsevier/Gold Standard (2010-05-31 12:57:37)

## 2017-03-26 LAB — CMP14+EGFR
ALT: 25 IU/L (ref 0–32)
AST: 20 IU/L (ref 0–40)
Albumin/Globulin Ratio: 1.8 (ref 1.2–2.2)
Albumin: 4.8 g/dL (ref 3.5–5.5)
Alkaline Phosphatase: 86 IU/L (ref 39–117)
BUN/Creatinine Ratio: 11 (ref 9–23)
BUN: 11 mg/dL (ref 6–24)
Bilirubin Total: 0.3 mg/dL (ref 0.0–1.2)
CO2: 23 mmol/L (ref 18–29)
Calcium: 10.3 mg/dL — ABNORMAL HIGH (ref 8.7–10.2)
Chloride: 105 mmol/L (ref 96–106)
Creatinine, Ser: 1.03 mg/dL — ABNORMAL HIGH (ref 0.57–1.00)
GFR calc Af Amer: 76 mL/min/{1.73_m2} (ref >59)
GFR calc non Af Amer: 66 mL/min/{1.73_m2} (ref >59)
GLUCOSE: 86 mg/dL (ref 65–99)
Globulin, Total: 2.6 g/dL (ref 1.5–4.5)
Potassium: 4.3 mmol/L (ref 3.5–5.2)
SODIUM: 143 mmol/L (ref 134–144)
Total Protein: 7.4 g/dL (ref 6.0–8.5)

## 2017-03-26 LAB — VITAMIN D 25 HYDROXY (VIT D DEFICIENCY, FRACTURES): Vit D, 25-Hydroxy: 21.1 ng/mL — ABNORMAL LOW (ref 30.0–100.0)

## 2017-03-30 ENCOUNTER — Telehealth: Payer: Self-pay

## 2017-03-30 NOTE — Telephone Encounter (Signed)
CMA call regarding lab results  Patient Verfiy DOB  Patient was aware and understood

## 2017-03-30 NOTE — Telephone Encounter (Signed)
-----   Message from Alfonse Spruce, [place] sent at 03/30/2017 12:28 PM EDT ----- Labs that evaluated your blood cells, fluid and electrolyte balance are normal. No signs of anemia, infection, or inflammation present. Kidney function normal Liver function normal Vitamin D level was low. Vitamin D helps to keep bones strong. You recommend over the counter (OTC) daily vitamin d supplement with 400 IU or multivitamin with recommended  daily vitamin d amount.  Recommend routine follow up in 3 months.

## 2017-04-29 ENCOUNTER — Ambulatory Visit: Payer: Self-pay | Attending: Family Medicine | Admitting: *Deleted

## 2017-04-29 VITALS — BP 122/72 | HR 75

## 2017-04-29 DIAGNOSIS — Z0130 Encounter for examination of blood pressure without abnormal findings: Secondary | ICD-10-CM | POA: Insufficient documentation

## 2017-04-29 NOTE — Progress Notes (Signed)
Pt arrived to [facility], alert and oriented and arrives in good spirits. Last OV 03/25/2017 with M.Hairston,FNP.  Pt denies chest pain, SOB, HA, dizziness, or blurred vision. She states she only took medication for a short amount of time because she  began to experience a pain on the left side of her face when she applies lip stick or when she licks the left side of her mouth.    Verified medication with patient. No longer taking HCTZ 12.5 mg.  Manual blood pressure reading: 110/70 left arm          122/72 right arm Pt states she has changed diet. No longer eats sausage and bacon. She states before she would eat pork a lot. She also adds that she does a fair amount of yard work.  Pt education provided on signs and symptoms of stroke and elevated blood pressure.    Pt aware to follow up as needed. Instructed to call office  in 3 months to schedule appointment with PCP.

## 2018-01-17 ENCOUNTER — Encounter (HOSPITAL_COMMUNITY): Payer: Self-pay | Admitting: Emergency Medicine

## 2018-01-17 ENCOUNTER — Emergency Department (HOSPITAL_COMMUNITY)
Admission: EM | Admit: 2018-01-17 | Discharge: 2018-01-17 | Disposition: A | Discharge status: Home / Self Care | Payer: No Typology Code available for payment source | Attending: Emergency Medicine | Admitting: Emergency Medicine

## 2018-01-17 ENCOUNTER — Emergency Department (HOSPITAL_COMMUNITY): Payer: No Typology Code available for payment source

## 2018-01-17 DIAGNOSIS — M25562 Pain in left knee: Secondary | ICD-10-CM | POA: Insufficient documentation

## 2018-01-17 DIAGNOSIS — J45909 Unspecified asthma, uncomplicated: Secondary | ICD-10-CM | POA: Diagnosis not present

## 2018-01-17 DIAGNOSIS — Z8571 Personal history of Hodgkin lymphoma: Secondary | ICD-10-CM | POA: Insufficient documentation

## 2018-01-17 DIAGNOSIS — M25552 Pain in left hip: Secondary | ICD-10-CM | POA: Diagnosis not present

## 2018-01-17 DIAGNOSIS — F17210 Nicotine dependence, cigarettes, uncomplicated: Secondary | ICD-10-CM | POA: Diagnosis not present

## 2018-01-17 DIAGNOSIS — Z041 Encounter for examination and observation following transport accident: Secondary | ICD-10-CM | POA: Insufficient documentation

## 2018-01-17 DIAGNOSIS — Z79899 Other long term (current) drug therapy: Secondary | ICD-10-CM | POA: Diagnosis not present

## 2018-01-17 DIAGNOSIS — M940 Chondrocostal junction syndrome [Tietze]: Secondary | ICD-10-CM

## 2018-01-17 DIAGNOSIS — R51 Headache: Secondary | ICD-10-CM | POA: Diagnosis present

## 2018-01-17 MED ORDER — METHOCARBAMOL 500 MG PO TABS: 500.0000 mg | ORAL_TABLET | Freq: Three times a day (TID) | ORAL | 0 refills | Status: DC | PRN

## 2018-01-17 MED ORDER — NAPROXEN 500 MG PO TABS: 500.0000 mg | ORAL_TABLET | Freq: Two times a day (BID) | ORAL | 1 refills | Status: DC
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
## 2020-02-20 ENCOUNTER — Ambulatory Visit: Payer: Self-pay | Admitting: Family Medicine

## 2020-03-14 ENCOUNTER — Telehealth: Payer: Self-pay | Admitting: Family Medicine

## 2020-06-19 ENCOUNTER — Telehealth: Payer: Self-pay

## 2020-06-19 NOTE — Telephone Encounter (Signed)
I called back the Pt an inform her that she need to see the PCP first before she can apply for the CAFA and the [facility] card program, Pt understood

## 2020-06-19 NOTE — Telephone Encounter (Signed)
Please follow up Copied from [place] [phone]. Topic: General - Other >> Jun 18, 2020 11:14 AM Keene Breath wrote: Reason for CRM: Patient called to reapply for the orange card.  Please call patient to discuss at [phone]

## 2020-09-05 ENCOUNTER — Ambulatory Visit: Payer: Self-pay | Admitting: Nurse Practitioner

## 2020-10-05 ENCOUNTER — Ambulatory Visit: Payer: Self-pay | Admitting: Family Medicine

## 2020-11-05 ENCOUNTER — Ambulatory Visit: Payer: Self-pay | Admitting: Family Medicine

## 2020-11-09 ENCOUNTER — Ambulatory Visit: Payer: Self-pay | Admitting: Internal Medicine

## 2023-06-30 ENCOUNTER — Encounter: Payer: Self-pay | Admitting: Internal Medicine

## 2023-06-30 ENCOUNTER — Ambulatory Visit (INDEPENDENT_AMBULATORY_CARE_PROVIDER_SITE_OTHER): Payer: Self-pay | Admitting: Internal Medicine

## 2023-06-30 ENCOUNTER — Telehealth: Payer: Self-pay | Admitting: Internal Medicine

## 2023-06-30 VITALS — BP 140/80 | HR 64 | Temp 98.7°F | Ht 63.0 in | Wt 121.6 lb

## 2023-06-30 DIAGNOSIS — R634 Abnormal weight loss: Secondary | ICD-10-CM

## 2023-06-30 DIAGNOSIS — R030 Elevated blood-pressure reading, without diagnosis of hypertension: Secondary | ICD-10-CM

## 2023-06-30 DIAGNOSIS — K219 Gastro-esophageal reflux disease without esophagitis: Secondary | ICD-10-CM

## 2023-06-30 DIAGNOSIS — E559 Vitamin D deficiency, unspecified: Secondary | ICD-10-CM | POA: Insufficient documentation

## 2023-06-30 DIAGNOSIS — Z720 Tobacco use: Secondary | ICD-10-CM | POA: Insufficient documentation

## 2023-06-30 LAB — CBC WITH DIFFERENTIAL/PLATELET
Basophils Absolute: 0 10*3/uL (ref 0.0–0.1)
Basophils Relative: 0.3 % (ref 0.0–3.0)
Eosinophils Absolute: 0.1 10*3/uL (ref 0.0–0.7)
Eosinophils Relative: 0.9 % (ref 0.0–5.0)
HCT: 41.8 % (ref 36.0–46.0)
Hemoglobin: 13.3 g/dL (ref 12.0–15.0)
Lymphocytes Relative: 18.4 % (ref 12.0–46.0)
Lymphs Abs: 1.7 10*3/uL (ref 0.7–4.0)
MCHC: 31.9 g/dL (ref 30.0–36.0)
MCV: 88.4 fl (ref 78.0–100.0)
Monocytes Absolute: 0.4 10*3/uL (ref 0.1–1.0)
Monocytes Relative: 4.6 % (ref 3.0–12.0)
Neutro Abs: 7 10*3/uL (ref 1.4–7.7)
Neutrophils Relative %: 75.8 % (ref 43.0–77.0)
Platelets: 250 10*3/uL (ref 150.0–400.0)
RBC: 4.72 Mil/uL (ref 3.87–5.11)
RDW: 15 % (ref 11.5–15.5)
WBC: 9.2 10*3/uL (ref 4.0–10.5)

## 2023-06-30 LAB — COMPREHENSIVE METABOLIC PANEL
ALT: 48 U/L — ABNORMAL HIGH (ref 0–35)
AST: 25 U/L (ref 0–37)
Albumin: 4.4 g/dL (ref 3.5–5.2)
Alkaline Phosphatase: 82 U/L (ref 39–117)
BUN: 10 mg/dL (ref 6–23)
CO2: 28 meq/L (ref 19–32)
Calcium: 9.8 mg/dL (ref 8.4–10.5)
Chloride: 105 meq/L (ref 96–112)
Creatinine, Ser: 0.86 mg/dL (ref 0.40–1.20)
GFR: 78.61 mL/min (ref >60.00)
Glucose, Bld: 74 mg/dL (ref 70–99)
Potassium: 3.9 meq/L (ref 3.5–5.1)
Sodium: 142 meq/L (ref 135–145)
Total Bilirubin: 0.5 mg/dL (ref 0.2–1.2)
Total Protein: 7.1 g/dL (ref 6.0–8.3)

## 2023-06-30 LAB — HEMOGLOBIN A1C: Hgb A1c MFr Bld: 6 % (ref 4.6–6.5)

## 2023-06-30 LAB — VITAMIN D 25 HYDROXY (VIT D DEFICIENCY, FRACTURES): VITD: 17.98 ng/mL — ABNORMAL LOW (ref 30.00–100.00)

## 2023-06-30 LAB — TSH: TSH: 1.04 u[IU]/mL (ref 0.35–5.50)

## 2023-06-30 MED ORDER — FAMOTIDINE 40 MG PO TABS: 40.0000 mg | ORAL_TABLET | Freq: Every day | ORAL | 1 refills | Status: AC
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
## 2023-06-30 NOTE — Telephone Encounter (Signed)
Spoke to pt in office and advised we cannot file visit to [facility] PLanning. Pt to contact her case worker with [facility] and request this be updates properly. Pt thought she had full Medicaid coverage and showed me the letter she received.

## 2023-06-30 NOTE — Patient Instructions (Signed)
BLOOD PRESSURE: Obtain an automatic blood pressure machine if you do not have one.   Check your blood pressure at home, write down blood pressure readings and bring to next appointment.  Goal is BP less than 140/90 consistently.  Adhere to a low salt diet ( no more than 1500mg of salt/sodium per day) and exercise regularly   The nutrition facts label is a good place to find how much sodium is in foods. Look for products with no more than 400 mg of sodium per serving.  Remember that 1.5 g = 1500 mg.  The food label may also list foods as:  Sodium-free: Less than 5 mg in a serving.  Very low sodium: 35 mg or less in a serving.  Low-sodium: 140 mg or less in a serving.  Light in sodium: 50% less sodium in a serving. For example, if a food that usually has 300 mg of sodium is changed to become light in sodium, it will have 150 mg of sodium.  Reduced sodium: 25% less sodium in a serving. For example, if a food that usually has 400 mg of sodium is changed to reduced sodium, it will have 300 mg of sodium.   

## 2023-07-02 ENCOUNTER — Other Ambulatory Visit: Payer: Self-pay | Admitting: Internal Medicine

## 2023-07-02 DIAGNOSIS — E559 Vitamin D deficiency, unspecified: Secondary | ICD-10-CM

## 2023-07-02 MED ORDER — VITAMIN D (ERGOCALCIFEROL) 1.25 MG (50000 UNIT) PO CAPS
50000.0000 [IU] | ORAL_CAPSULE | ORAL | 1 refills | Status: DC
Start: 2023-07-02 — End: 2023-12-22

## 2023-12-22 ENCOUNTER — Other Ambulatory Visit: Payer: Self-pay | Admitting: Internal Medicine

## 2023-12-22 DIAGNOSIS — E559 Vitamin D deficiency, unspecified: Secondary | ICD-10-CM

## 2024-01-20 ENCOUNTER — Telehealth: Payer: Self-pay

## 2024-01-20 ENCOUNTER — Other Ambulatory Visit (HOSPITAL_COMMUNITY): Payer: Self-pay

## 2024-01-20 NOTE — Telephone Encounter (Signed)
 Pharmacy Patient Advocate Encounter  Received notification from [facility] MEDICAID that Prior Authorization for Vitamin D (Ergocalciferol) 1.25 MG(50000 UT) capsules has been  not sent. This is a plan exclusion drug and not covered. Spoke with pharmacy and she paid out of pocket using a Good RX card.   PA #/Case ID/Reference #: --

## 2024-09-22 ENCOUNTER — Emergency Department (HOSPITAL_COMMUNITY)
Admission: EM | Admit: 2024-09-22 | Discharge: 2024-09-22 | Disposition: A | Discharge status: Home / Self Care | Payer: Self-pay | Attending: Emergency Medicine | Admitting: Emergency Medicine

## 2024-09-22 DIAGNOSIS — L723 Sebaceous cyst: Secondary | ICD-10-CM

## 2024-09-22 DIAGNOSIS — L729 Follicular cyst of the skin and subcutaneous tissue, unspecified: Secondary | ICD-10-CM | POA: Insufficient documentation

## 2024-09-22 NOTE — ED Provider Notes (Signed)
 [place] EMERG[facility] DEPARTMENT AT [facility] Provider Note   CSN: [id number] Arrival date & time: 09/22/24  1325     Patient presents with: Cyst   Dawn Foster is a 53 y.o. female.   52 year old female presents with concern for infected cyst to the left side of her face.  States that she has some drainage so days ago which is since stopped.  Denies any fever.  Notes that the cyst has been quite sometime.  No intraoral involvement.       Prior to Admission medications   Medication Sig Start Date End Date Taking? Authorizing Provider  famotidine  (PEPCID ) 40 MG tablet Take 1 tablet (40 mg total) by mouth daily. 06/30/23   Billy Knee, FNP  Vitamin D , Ergocalciferol , (DRISDOL ) 1.25 MG (50000 UNIT) CAPS capsule TAKE 1 CAPSULE BY MOUTH EVERY 7 DAYS 12/22/23   Billy Knee, FNP    Allergies: Patient has no known allergies.    Review of Systems  All other systems reviewed and are negative.   Updated Vital Signs BP (!) 167/96   Pulse 72   Temp (!) 97.3 F (36.3 C) (Oral)   Resp 16   SpO2 99%   Physical Exam Vitals and nursing note reviewed.  Constitutional:      General: She is not in acute distress.    Appearance: Normal appearance. She is well-developed. She is not toxic-appearing.  HENT:     Head: Normocephalic and atraumatic.   Eyes:     General: Lids are normal.     Conjunctiva/sclera: Conjunctivae normal.     Pupils: Pupils are equal, round, and reactive to light.  Neck:     Thyroid: No thyroid mass.     Trachea: No tracheal deviation.  Cardiovascular:     Rate and Rhythm: Normal rate and regular rhythm.     Heart sounds: Normal heart sounds. No murmur heard.    No gallop.  Pulmonary:     Effort: Pulmonary effort is normal. No respiratory distress.     Breath sounds: Normal breath sounds. No stridor. No decreased breath sounds, wheezing, rhonchi or rales.  Abdominal:     General: There is no distension.     Palpations: Abdomen is soft.      Tenderness: There is no abdominal tenderness. There is no rebound.  Musculoskeletal:        General: No tenderness. Normal range of motion.     Cervical back: Normal range of motion and neck supple.  Skin:    General: Skin is warm and dry.     Findings: No abrasion or rash.  Neurological:     Mental Status: She is alert and oriented to person, place, and time. Mental status is at baseline.     GCS: GCS eye subscore is 4. GCS verbal subscore is 5. GCS motor subscore is 6.     Cranial Nerves: No cranial nerve deficit.     Sensory: No sensory deficit.     Motor: Motor function is intact.  Psychiatric:        Attention and Perception: Attention normal.        Speech: Speech normal.        Behavior: Behavior normal.     (all labs ordered are listed, but only abnormal results are displayed) Labs Reviewed - No data to display  EKG: None  Radiology: No results found.   Procedures   Medications Ordered in the ED - No data to display  Medical Decision Making  No evidence of infection to the patient's extremities which is been for quite some time.  Patient traditionally to follow-up with dermatology or plastic surgery for drainage.  No indication for antibiotics at this time     Final diagnoses:  None    ED Discharge Orders     None          Dasie Faden, MD 09/22/24 1455

## 2024-09-22 NOTE — ED Triage Notes (Signed)
 Pt states she has a cyst on her L cheek that she's had for over one year. Pt states over the last week it's became increasingly swollen and having drainage.

## 2024-09-22 NOTE — Discharge Instructions (Signed)
 This is here patient has no signs of infection this time.  Follow-up with a dermatologist or plastic surgeon of your choosing to have this taken off
